# Patient Record
Sex: Male | Born: 1960 | Race: Black or African American | Hispanic: No | Marital: Single | Smoking: Current every day smoker
Health system: Southern US, Community
[De-identification: ages and names within clinical notes are randomized; demographics above are authoritative.]

## PROBLEM LIST (undated history)

## (undated) DIAGNOSIS — N289 Disorder of kidney and ureter, unspecified: Secondary | ICD-10-CM

---

## 1999-01-23 ENCOUNTER — Emergency Department (HOSPITAL_COMMUNITY): Admission: EM | Admit: 1999-01-23 | Discharge: 1999-01-23 | Payer: Self-pay | Admitting: Emergency Medicine

## 1999-01-23 ENCOUNTER — Encounter: Payer: Self-pay | Admitting: Emergency Medicine

## 1999-01-26 ENCOUNTER — Emergency Department (HOSPITAL_COMMUNITY): Admission: EM | Admit: 1999-01-26 | Discharge: 1999-01-26 | Payer: Self-pay | Admitting: Emergency Medicine

## 1999-02-02 ENCOUNTER — Emergency Department (HOSPITAL_COMMUNITY): Admission: EM | Admit: 1999-02-02 | Discharge: 1999-02-02 | Payer: Self-pay | Admitting: Emergency Medicine

## 1999-05-02 ENCOUNTER — Emergency Department (HOSPITAL_COMMUNITY): Admission: EM | Admit: 1999-05-02 | Discharge: 1999-05-02 | Payer: Self-pay | Admitting: Emergency Medicine

## 2004-01-04 ENCOUNTER — Emergency Department (HOSPITAL_COMMUNITY): Admission: EM | Admit: 2004-01-04 | Discharge: 2004-01-04 | Payer: Self-pay | Admitting: Emergency Medicine

## 2010-01-11 ENCOUNTER — Inpatient Hospital Stay (HOSPITAL_COMMUNITY)
Admission: EM | Admit: 2010-01-11 | Discharge: 2010-01-16 | Payer: Self-pay | Source: Home / Self Care | Admitting: Emergency Medicine

## 2010-01-12 ENCOUNTER — Encounter (INDEPENDENT_AMBULATORY_CARE_PROVIDER_SITE_OTHER): Payer: Self-pay | Admitting: Internal Medicine

## 2010-01-17 ENCOUNTER — Encounter (HOSPITAL_BASED_OUTPATIENT_CLINIC_OR_DEPARTMENT_OTHER)
Admission: RE | Admit: 2010-01-17 | Discharge: 2010-02-08 | Payer: Self-pay | Source: Home / Self Care | Admitting: General Surgery

## 2010-01-20 ENCOUNTER — Ambulatory Visit: Payer: Self-pay | Admitting: Internal Medicine

## 2010-01-20 DIAGNOSIS — F17200 Nicotine dependence, unspecified, uncomplicated: Secondary | ICD-10-CM

## 2010-01-20 DIAGNOSIS — L03119 Cellulitis of unspecified part of limb: Secondary | ICD-10-CM

## 2010-01-20 DIAGNOSIS — L02619 Cutaneous abscess of unspecified foot: Secondary | ICD-10-CM

## 2010-04-21 ENCOUNTER — Emergency Department (HOSPITAL_COMMUNITY)
Admission: EM | Admit: 2010-04-21 | Discharge: 2010-04-21 | Payer: Self-pay | Source: Home / Self Care | Admitting: Emergency Medicine

## 2010-05-12 ENCOUNTER — Encounter (INDEPENDENT_AMBULATORY_CARE_PROVIDER_SITE_OTHER): Payer: Self-pay | Admitting: Nurse Practitioner

## 2010-05-16 NOTE — Assessment & Plan Note (Signed)
Summary: XFU:  Right foot cellulitis   Vital Signs:  Patient profile:   50 year old male Height:      62.35 inches Weight:      143.7 pounds BMI:     26.08 Temp:     97.9 degrees F oral Pulse rate:   88 / minute Pulse rhythm:   regular Resp:     18 per minute BP sitting:   122 / 86  (left arm) Cuff size:   regular  Vitals Entered By: CMA Student Linzie Collin CC: f/u visit hospital patient Is Patient Diabetic? No Pain Assessment Patient in pain? no       Does patient need assistance? Functional Status Self care Ambulation Normal   CC:  f/u visit hospital patient.  History of Present Illness: New pt.  Admx to [facility] recently with right foot wound complicated by cellulitis and abscess.  He also had concomitant widespread tinea.  He required I&D.  He has had f/u with the wound clnic.  THey have kept him out of work until f/u next week.  He is supposed to see the surgeon back for f/u but cannot get an appt.  His foot feels better.  He wraps it daily.  No drainage or pain.  No fevers.    Habits & Providers  Alcohol-Tobacco-Diet     Tobacco Status: current  Exercise-Depression-Behavior     Drug Use: no  Current Medications (verified): 1)  Doxycycline Hyclate 100 Mg Caps (Doxycycline Hyclate) .... Take 1 Tablet By Mouth Twice Daily Morning and Night 2)  Hydrocodone-Acetaminophen 5-325 Mg Tabs (Hydrocodone-Acetaminophen) .... Take 1 Tablet By Mouth Every 4 Hours As Needed For Pain 3)  Cipro 500 Mg Tabs (Ciprofloxacin Hcl) .Marland Kitchen.. 1 Tablet By Mouth Twice Daily Morning and Night 4)  Diflucan 100 Mg Tabs (Fluconazole) .... Take 1 Tablet By Mouth Daily Morning  Allergies (verified): No Known Drug Allergies  Past History:  Past Medical History: infected right foot woud complicated by cellulitis/sq abscess - admx to [facility] 9/28 to 10/3 chronic dermatomycosis tobacco abuse  Past Surgical History: s/p ORIF 2/2 fracture of Left Leg 1990s s/p I&D of right foot wound 01/13/2010  a.  Dr. Rennis Chris (ortho) and Dr. 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 CV:  Denies chest pain or discomfort and shortness of breath with exertion. Resp:  Denies cough and wheezing. GI:  Denies change in bowel habits. GU:  Denies dysuria.  Physical Exam  General:  alert, well-developed, and well-nourished.   Head:  normocephalic and atraumatic.   Neck:  supple.   Lungs:  normal breath sounds.  + exp wheezes bilat bases  Heart:  normal rate and regular rhythm.   Abdomen:  soft, non-tender, and no hepatomegaly.   Extremities:  right foot with dressing in place; dry Neurologic:  alert & oriented X3 and cranial nerves II-XII intact.   Psych:  normally interactive.     Impression & Recommendations:  Problem # 1:  PREVENTIVE HEALTH CARE (ICD-V70.0) CMET, CBC, TSH ok in hosp except TP/Alb levels a little low A1C 5.6 HIV non reactive arrange CPE  Problem # 2:  CELLULITIS, FOOT, RIGHT (ICD-682.7) has f/u with wound clinic wants note to go back to  work but Dr. Jimmey Ralph gave him a note to stay out until he returns for f/u next week advised patient of this finish antibiotics as directed  His updated medication list for this problem includes:    Doxycycline Hyclate 100 Mg Caps (Doxycycline hyclate) .Marland Kitchen... Take 1 tablet by mouth twice daily morning and night    Cipro 500 Mg Tabs (Ciprofloxacin hcl) .Marland Kitchen... 1 tablet by mouth twice daily morning and night  Problem # 3:  SMOKER (ICD-305.1)  wheezing on exam prob has copd give proventil to use as needed   Complete Medication List: 1)  Doxycycline Hyclate 100 Mg Caps (Doxycycline hyclate) .... Take 1 tablet by mouth twice daily morning and night 2)   Hydrocodone-acetaminophen 5-325 Mg Tabs (Hydrocodone-acetaminophen) .... Take 1 tablet by mouth every 4 hours as needed for pain 3)  Cipro 500 Mg Tabs (Ciprofloxacin hcl) .Marland Kitchen.. 1 tablet by mouth twice daily morning and night 4)  Diflucan 100 Mg Tabs (Fluconazole) .... Take 1 tablet by mouth daily morning 5)  Proventil Hfa 108 (90 Base) Mcg/act Aers (Albuterol sulfate) .Marland Kitchen.. 1-2 puffs every 4-6 hours as needed for wheezing or cough or shortness of breath  Patient Instructions: 1)  Kenyatta:  Please schedule follow up at Dr. Caryn Bee Supple's office.  He was to follow up from [facility] by 01/30/2010. 2)  Schedule eligibility appt. 3)  Schedule CPE in 3 months. 4)  Keep follow up with Dr. Jimmey Ralph at the wound clinic next week. 5)  Return sooner if needed for any problems.  Prescriptions: PROVENTIL HFA 108 (90 BASE) MCG/ACT AERS (ALBUTEROL SULFATE) 1-2 puffs every 4-6 hours as needed for wheezing or cough or shortness of breath  #1 x 1   Entered and Authorized by:   Tereso Newcomer PA-C   Signed by:   Tereso Newcomer PA-C on 01/20/2010   Method used:   Print then Give to Patient   RxID:   [id number]

## 2010-05-18 NOTE — Letter (Signed)
Summary: mailed plan of care 01/17/10 to 03/17/10  mailed plan of care 01/17/10 to 03/17/10   Imported By: Silvio Pate Stanislawscyk 05/12/2010 09:54:57  _____________________________________________________________________  External Attachment:    Type:   Image     Comment:   External Document

## 2010-06-29 LAB — CBC
HCT: 34.9 % — ABNORMAL LOW (ref 39.0–52.0)
Hemoglobin: 12.1 g/dL — ABNORMAL LOW (ref 13.0–17.0)
Hemoglobin: 13.4 g/dL (ref 13.0–17.0)
Hemoglobin: 13.7 g/dL (ref 13.0–17.0)
MCH: 29.5 pg (ref 26.0–34.0)
MCH: 29.5 pg (ref 26.0–34.0)
MCH: 29.6 pg (ref 26.0–34.0)
MCHC: 34.7 g/dL (ref 30.0–36.0)
MCHC: 34.7 g/dL (ref 30.0–36.0)
MCHC: 36.2 g/dL — ABNORMAL HIGH (ref 30.0–36.0)
MCV: 83.5 fL (ref 78.0–100.0)
MCV: 85.1 fL (ref 78.0–100.0)
MCV: 85.2 fL (ref 78.0–100.0)
Platelets: 200 10*3/uL (ref 150–400)
Platelets: 206 10*3/uL (ref 150–400)
Platelets: 189 10*3/uL (ref 150–400)
RBC: 4.1 MIL/uL — ABNORMAL LOW (ref 4.22–5.81)
RBC: 4.02 MIL/uL — ABNORMAL LOW (ref 4.22–5.81)
RBC: 4.54 MIL/uL (ref 4.22–5.81)
RDW: 14.1 % (ref 11.5–15.5)
RDW: 14.1 % (ref 11.5–15.5)
RDW: 14.3 % (ref 11.5–15.5)
WBC: 5.6 10*3/uL (ref 4.0–10.5)
WBC: 6.1 10*3/uL (ref 4.0–10.5)
WBC: 7.9 10*3/uL (ref 4.0–10.5)

## 2010-06-29 LAB — DIFFERENTIAL
Basophils Absolute: 0 10*3/uL (ref 0.0–0.1)
Basophils Relative: 1 % (ref 0–1)
Monocytes Relative: 10 % (ref 3–12)
Neutro Abs: 4.6 10*3/uL (ref 1.7–7.7)
Neutrophils Relative %: 58 % (ref 43–77)

## 2010-06-29 LAB — BASIC METABOLIC PANEL
BUN: 9 mg/dL (ref 6–23)
BUN: 9 mg/dL (ref 6–23)
BUN: 8 mg/dL (ref 6–23)
CO2: 25 mEq/L (ref 19–32)
CO2: 25 mEq/L (ref 19–32)
Calcium: 8.3 mg/dL — ABNORMAL LOW (ref 8.4–10.5)
Calcium: 8.4 mg/dL (ref 8.4–10.5)
Calcium: 8.6 mg/dL (ref 8.4–10.5)
Calcium: 8.2 mg/dL — ABNORMAL LOW (ref 8.4–10.5)
Chloride: 107 mEq/L (ref 96–112)
Creatinine, Ser: 1.25 mg/dL (ref 0.4–1.5)
Creatinine, Ser: 1.42 mg/dL (ref 0.4–1.5)
Creatinine, Ser: 1.27 mg/dL (ref 0.4–1.5)
GFR calc Af Amer: >60 mL/min (ref >60)
GFR calc Af Amer: >60 mL/min (ref >60)
GFR calc Af Amer: >60 mL/min (ref >60)
GFR calc Af Amer: >60 mL/min (ref >60)
GFR calc non Af Amer: 52 mL/min — ABNORMAL LOW (ref >60)
GFR calc non Af Amer: 53 mL/min — ABNORMAL LOW (ref >60)
GFR calc non Af Amer: >60 mL/min (ref >60)
Glucose, Bld: 115 mg/dL — ABNORMAL HIGH (ref 70–99)
Glucose, Bld: 87 mg/dL (ref 70–99)
Potassium: 3.6 mEq/L (ref 3.5–5.1)
Sodium: 138 mEq/L (ref 135–145)
Sodium: 139 mEq/L (ref 135–145)

## 2010-06-29 LAB — COMPREHENSIVE METABOLIC PANEL
AST: 14 U/L (ref 0–37)
Alkaline Phosphatase: 57 U/L (ref 39–117)
BUN: 12 mg/dL (ref 6–23)
CO2: 22 mEq/L (ref 19–32)
Chloride: 115 mEq/L — ABNORMAL HIGH (ref 96–112)
Creatinine, Ser: 1.38 mg/dL (ref 0.4–1.5)
GFR calc Af Amer: >60 mL/min (ref >60)
GFR calc non Af Amer: 55 mL/min — ABNORMAL LOW (ref >60)
Total Bilirubin: 0.6 mg/dL (ref 0.3–1.2)

## 2010-06-29 LAB — VANCOMYCIN, TROUGH: Vancomycin Tr: 16.8 ug/mL (ref 10.0–20.0)

## 2010-06-29 LAB — RAPID URINE DRUG SCREEN, HOSP PERFORMED: Barbiturates: NOT DETECTED

## 2010-06-29 LAB — CULTURE, BLOOD (ROUTINE X 2): Culture  Setup Time: 201109290855

## 2010-06-29 LAB — POCT I-STAT, CHEM 8
Chloride: 111 mEq/L (ref 96–112)
Glucose, Bld: 102 mg/dL — ABNORMAL HIGH (ref 70–99)
HCT: 42 % (ref 39.0–52.0)
Potassium: 3.5 mEq/L (ref 3.5–5.1)

## 2010-06-29 LAB — HEMOGLOBIN A1C: Hgb A1c MFr Bld: 5.6 % (ref <5.7)

## 2010-06-29 LAB — TSH: TSH: 1.863 u[IU]/mL (ref 0.350–4.500)

## 2010-06-29 LAB — HIV ANTIBODY (ROUTINE TESTING W REFLEX): HIV: NONREACTIVE

## 2010-06-30 ENCOUNTER — Emergency Department (HOSPITAL_COMMUNITY)
Admission: EM | Admit: 2010-06-30 | Discharge: 2010-06-30 | Disposition: A | Discharge status: Home / Self Care | Payer: PRIVATE HEALTH INSURANCE | Attending: Emergency Medicine | Admitting: Emergency Medicine

## 2010-06-30 DIAGNOSIS — B353 Tinea pedis: Secondary | ICD-10-CM | POA: Insufficient documentation

## 2010-06-30 DIAGNOSIS — L299 Pruritus, unspecified: Secondary | ICD-10-CM | POA: Insufficient documentation

## 2011-03-08 ENCOUNTER — Emergency Department (HOSPITAL_COMMUNITY)
Admission: EM | Admit: 2011-03-08 | Discharge: 2011-03-08 | Disposition: A | Discharge status: Home / Self Care | Payer: PRIVATE HEALTH INSURANCE | Attending: Emergency Medicine | Admitting: Emergency Medicine

## 2011-03-08 DIAGNOSIS — F17200 Nicotine dependence, unspecified, uncomplicated: Secondary | ICD-10-CM | POA: Insufficient documentation

## 2011-03-08 DIAGNOSIS — L089 Local infection of the skin and subcutaneous tissue, unspecified: Secondary | ICD-10-CM | POA: Insufficient documentation

## 2011-03-08 LAB — GLUCOSE, CAPILLARY: Glucose-Capillary: 98 mg/dL (ref 70–99)

## 2011-03-08 MED ORDER — MOXIFLOXACIN HCL 400 MG PO TABS
400.0000 mg | ORAL_TABLET | Freq: Once | ORAL | Status: AC
  Administered 2011-03-08: 400 mg via ORAL
  Filled 2011-03-08: qty 1

## 2011-03-08 MED ORDER — CEPHALEXIN 500 MG PO CAPS: 500.0000 mg | ORAL_CAPSULE | Freq: Four times a day (QID) | ORAL | Status: AC

## 2011-03-08 NOTE — ED Notes (Signed)
Patient here with right foot wound that is draining x 3 weeks. Wound is posterior foot under base of toes, drainage noted

## 2011-03-08 NOTE — ED Provider Notes (Signed)
History     CSN: [id number] Arrival date & time: 03/08/2011 11:58 AM   First MD Initiated Contact with Patient 03/08/11 1228      No chief complaint on file.   (Consider location/radiation/quality/duration/timing/severity/associated sxs/prior treatment) The history is provided by the patient.  pt c/o right foot wound for 3 weeks. States area between right toes moist, draining small amt, sore. No injury. Stats hx same. Denies hx diabetes. No fever or chills. No leg or calf pain. No numbness/weakness.   History reviewed. No pertinent past medical history.  No past surgical history on file.  No family history on file.  History  Substance Use Topics  . Smoking status: Current Everyday Smoker -- 1.0 packs/day  . Smokeless tobacco: Not on file  . Alcohol Use: No      Review of Systems  Constitutional: Negative for fever and chills.  HENT: Negative for neck pain.   Eyes: Negative for redness.  Respiratory: Negative for shortness of breath.   Cardiovascular: Negative for chest pain and leg swelling.  Gastrointestinal: Negative for abdominal pain.  Musculoskeletal: Negative for back pain and joint swelling.  Skin: Negative for rash.  Neurological: Negative for weakness and numbness.  Hematological: Does not bruise/bleed easily.  Psychiatric/Behavioral: Negative for confusion.    Allergies  Review of patient's allergies indicates no known allergies.  Home Medications  No current outpatient prescriptions on file.  BP 105/83  Pulse 74  Temp 97.9 F (36.6 C)  Resp 18  SpO2 99%  Physical Exam  Nursing note and vitals reviewed. Constitutional: He is oriented to person, place, and time. He appears well-developed and well-nourished. No distress.  HENT:  Head: Atraumatic.  Eyes: Pupils are equal, round, and reactive to light.  Neck: Neck supple. No tracheal deviation present.  Cardiovascular: Normal rate.   Pulmonary/Chest: Effort normal. No accessory muscle usage. No  respiratory distress.  Abdominal: He exhibits no distension.  Musculoskeletal: Normal range of motion.       Pt with moist, macerating skin between toes right foot. Mild clear drainage. Minimal erythema. No necrotic tissue. Normal cap refill distally. Dp/pt 2+. No lymphangitis.   Neurological: He is alert and oriented to person, place, and time.  Skin: Skin is warm and dry.  Psychiatric: He has a normal mood and affect.    ED Course  Procedures (including critical care time)  Labs Reviewed - No data to display No results found. Results for orders placed during the hospital encounter of 03/08/11  GLUCOSE, CAPILLARY      Component Value Range   Glucose-Capillary 98  70 - 99 (mg/dL)   Comment 1 Notify RN     Comment 2 Documented in Chart     No results found.    No diagnosis found.    MDM  Wound cleaned. Dry sterile dressing. avelox po.         Suzi Roots, MD 03/08/11 1318

## 2011-10-01 ENCOUNTER — Emergency Department (HOSPITAL_COMMUNITY): Payer: No Typology Code available for payment source

## 2011-10-01 ENCOUNTER — Emergency Department (HOSPITAL_COMMUNITY)
Admission: EM | Admit: 2011-10-01 | Discharge: 2011-10-02 | Disposition: A | Discharge status: Home / Self Care | Payer: No Typology Code available for payment source | Attending: Emergency Medicine | Admitting: Emergency Medicine

## 2011-10-01 ENCOUNTER — Encounter (HOSPITAL_COMMUNITY): Payer: Self-pay | Admitting: *Deleted

## 2011-10-01 DIAGNOSIS — M79609 Pain in unspecified limb: Secondary | ICD-10-CM | POA: Insufficient documentation

## 2011-10-01 DIAGNOSIS — L089 Local infection of the skin and subcutaneous tissue, unspecified: Secondary | ICD-10-CM

## 2011-10-01 DIAGNOSIS — F17200 Nicotine dependence, unspecified, uncomplicated: Secondary | ICD-10-CM | POA: Insufficient documentation

## 2011-10-01 DIAGNOSIS — L989 Disorder of the skin and subcutaneous tissue, unspecified: Secondary | ICD-10-CM | POA: Insufficient documentation

## 2011-10-01 MED ORDER — MOXIFLOXACIN HCL 400 MG PO TABS: 400.0000 mg | ORAL_TABLET | Freq: Every day | ORAL | Status: DC

## 2011-10-01 MED ORDER — MOXIFLOXACIN HCL 400 MG PO TABS: 400.0000 mg | ORAL_TABLET | Freq: Every day | ORAL | Status: AC

## 2011-10-01 NOTE — ED Provider Notes (Signed)
History     CSN: [id number]  Arrival date & time 10/01/11  7829   First MD Initiated Contact with Patient 10/01/11 2111      9:36 PM HPI Patient reports about 2-3 weeks ago was walking in the rain. States since then has had worsening wounds between his toes. Reports his attempts to keep area is clean and dry but drainage and wound appeared to be increasing. Reports similar symptoms last November. Denies fever or severe pain. Denies history of diabetes. Denies numbness, tingling or weakness.  Patient is a 51 y.o. male presenting with lower extremity pain. The history is provided by the patient.  Foot Pain This is a new problem. The current episode started 1 to 4 weeks ago. The problem occurs constantly. The problem has been gradually worsening. Pertinent negatives include no fever, joint swelling, numbness or weakness. The symptoms are aggravated by walking and standing.    History reviewed. No pertinent past medical history.  No past surgical history on file.  No family history on file.  History  Substance Use Topics  . Smoking status: Current Everyday Smoker -- 1.0 packs/day  . Smokeless tobacco: Not on file  . Alcohol Use: No      Review of Systems  Constitutional: Negative for fever.  Musculoskeletal: Negative for joint swelling.  Skin: Positive for color change and wound (foot pain).  Neurological: Negative for weakness and numbness.  All other systems reviewed and are negative.    Allergies  Review of patient's allergies indicates no known allergies.  Home Medications   Current Outpatient Rx  Name Route Sig Dispense Refill  . IBUPROFEN 200 MG PO TABS Oral Take 600 mg by mouth every 6 (six) hours as needed. Pain.      BP 111/74  Pulse 78  Temp 97.8 F (36.6 C) (Oral)  Resp 16  Ht 5\' 5"  (1.651 m)  Wt 145 lb (65.772 kg)  BMI 24.13 kg/m2  SpO2 98%  Physical Exam  Constitutional: He is oriented to person, place, and time. He appears well-developed and  well-nourished.  HENT:  Head: Normocephalic and atraumatic.  Eyes: Pupils are equal, round, and reactive to light.  Musculoskeletal:       Right foot: He exhibits tenderness. He exhibits normal range of motion, no swelling, normal capillary refill and no crepitus.       Right foot: Patient is severe maceration between third fourth and fifth toes. Draining a clear to yellowish colored liquid. No abscess palpated  Neurological: He is alert and oriented to person, place, and time.  Skin: Skin is warm and dry. No rash noted. No erythema. No pallor.  Psychiatric: He has a normal mood and affect. His behavior is normal.    ED Course  Procedures  Dg Foot Complete Right  10/01/2011  *RADIOLOGY REPORT*  Clinical Data: Right infection and pain.  RIGHT FOOT COMPLETE - 3+ VIEW  Comparison:  01/11/2010  Findings:  There is no evidence of fracture or dislocation.  There is no evidence of arthropathy or other focal bone abnormality. No evidence of osteolysis or periostitis.  Soft tissues are unremarkable.  IMPRESSION: Negative.  Original Report Authenticated By: Danae Orleans, M.D.     MDM     Xray was negative for osteo or necrosis. Advised close f/u with wound care. Will d/c with Avelox and referral for wound care. Pt voices understanding and is ready for d/c. Wound cleaned and redressed.      Thomasene Lot, PA-C 10/01/11 2238

## 2011-10-01 NOTE — Discharge Instructions (Signed)
Skin Infections A skin infection usually develops as a result of disruption of the skin barrier.  CAUSES  A skin infection might occur following:  Trauma or an injury to the skin such as a cut or insect sting.   Inflammation (as in eczema).   Breaks in the skin between the toes (as in athlete's foot).   Swelling (edema).  SYMPTOMS  The legs are the most common site affected. Usually there is:  Redness.   Swelling.   Pain.   There may be red streaks in the area of the infection.  TREATMENT   Minor skin infections may be treated with topical antibiotics, but if the skin infection is severe, hospital care and intravenous (IV) antibiotic treatment may be needed.   Most often skin infections can be treated with oral antibiotic medicine as well as proper rest and elevation of the affected area until the infection improves.   If you are prescribed oral antibiotics, it is important to take them as directed and to take all the pills even if you feel better before you have finished all of the medicine.   You may apply warm compresses to the area for 20-30 minutes 4 times daily.  You might need a tetanus shot now if:  You have no idea when you had the last one.   You have never had a tetanus shot before.   Your wound had dirt in it.  If you need a tetanus shot and you decide not to get one, there is a rare chance of getting tetanus. Sickness from tetanus can be serious. If you get a tetanus shot, your arm may swell and become red and warm at the shot site. This is common and not a problem. SEEK MEDICAL CARE IF:  The pain and swelling from your infection do not improve within 2 days.  SEEK IMMEDIATE MEDICAL CARE IF:  You develop a fever, chills, or other serious problems.  Document Released: 05/10/2004 Document Revised: 03/22/2011 Document Reviewed: 03/22/2008 Exit[employer] Patient Information 2012 [employer]. 

## 2011-10-01 NOTE — ED Notes (Signed)
Pt reports rt foot pain/wound x2-3 weeks, pt w/ excoriation noted to bottom of foot and in between toes. Pt states he has been attempting to keep it wrapped in guaze and putting cream on it w/o relief.

## 2011-10-02 NOTE — ED Provider Notes (Signed)
Medical screening examination/treatment/procedure(s) were performed by non-physician practitioner and as supervising physician I was immediately available for consultation/collaboration.   Hurman Horn, MD 10/02/11 [phone]

## 2011-12-13 ENCOUNTER — Inpatient Hospital Stay (HOSPITAL_COMMUNITY)
Admission: EM | Admit: 2011-12-13 | Discharge: 2011-12-15 | DRG: 683 | Disposition: A | Discharge status: Home / Self Care | Payer: No Typology Code available for payment source | Attending: Internal Medicine | Admitting: Internal Medicine

## 2011-12-13 ENCOUNTER — Encounter (HOSPITAL_COMMUNITY): Payer: Self-pay | Admitting: *Deleted

## 2011-12-13 ENCOUNTER — Emergency Department (HOSPITAL_COMMUNITY): Payer: No Typology Code available for payment source

## 2011-12-13 DIAGNOSIS — R252 Cramp and spasm: Secondary | ICD-10-CM | POA: Diagnosis present

## 2011-12-13 DIAGNOSIS — N289 Disorder of kidney and ureter, unspecified: Secondary | ICD-10-CM

## 2011-12-13 DIAGNOSIS — N179 Acute kidney failure, unspecified: Principal | ICD-10-CM | POA: Diagnosis present

## 2011-12-13 DIAGNOSIS — F17200 Nicotine dependence, unspecified, uncomplicated: Secondary | ICD-10-CM | POA: Diagnosis present

## 2011-12-13 DIAGNOSIS — R911 Solitary pulmonary nodule: Secondary | ICD-10-CM | POA: Diagnosis present

## 2011-12-13 DIAGNOSIS — E871 Hypo-osmolality and hyponatremia: Secondary | ICD-10-CM | POA: Diagnosis present

## 2011-12-13 DIAGNOSIS — E860 Dehydration: Secondary | ICD-10-CM | POA: Diagnosis present

## 2011-12-13 DIAGNOSIS — E876 Hypokalemia: Secondary | ICD-10-CM | POA: Diagnosis not present

## 2011-12-13 LAB — CBC WITH DIFFERENTIAL/PLATELET
Basophils Absolute: 0 10*3/uL (ref 0.0–0.1)
Basophils Relative: 0 % (ref 0–1)
Eosinophils Absolute: 0.1 10*3/uL (ref 0.0–0.7)
Eosinophils Relative: 1 % (ref 0–5)
HCT: 45.9 % (ref 39.0–52.0)
MCH: 30.4 pg (ref 26.0–34.0)
MCHC: 37 g/dL — ABNORMAL HIGH (ref 30.0–36.0)
MCV: 82 fL (ref 78.0–100.0)
Monocytes Absolute: 1.3 10*3/uL — ABNORMAL HIGH (ref 0.1–1.0)
Neutro Abs: 8 10*3/uL — ABNORMAL HIGH (ref 1.7–7.7)
RDW: 13.7 % (ref 11.5–15.5)

## 2011-12-13 LAB — COMPREHENSIVE METABOLIC PANEL
AST: 15 U/L (ref 0–37)
Albumin: 5 g/dL (ref 3.5–5.2)
BUN: 24 mg/dL — ABNORMAL HIGH (ref 6–23)
Calcium: 10.2 mg/dL (ref 8.4–10.5)
Creatinine, Ser: 2.58 mg/dL — ABNORMAL HIGH (ref 0.50–1.35)
Total Protein: 8.6 g/dL — ABNORMAL HIGH (ref 6.0–8.3)

## 2011-12-13 LAB — URINALYSIS, ROUTINE W REFLEX MICROSCOPIC
Bilirubin Urine: NEGATIVE
Ketones, ur: NEGATIVE mg/dL
Protein, ur: NEGATIVE mg/dL
Specific Gravity, Urine: 1.021 (ref 1.005–1.030)
Urobilinogen, UA: 0.2 mg/dL (ref 0.0–1.0)

## 2011-12-13 LAB — LIPASE, BLOOD: Lipase: 24 U/L (ref 11–59)

## 2011-12-13 LAB — URINE MICROSCOPIC-ADD ON

## 2011-12-13 MED ORDER — SODIUM CHLORIDE 0.9 % IV BOLUS (SEPSIS)
1000.0000 mL | Freq: Once | INTRAVENOUS | Status: AC
  Administered 2011-12-13: 1000 mL via INTRAVENOUS

## 2011-12-13 NOTE — ED Notes (Signed)
Pt states "I'm cramped up in my stomach side." denies n/v. C/o abd pain. Also c/o pain to left leg. "charlie horse."

## 2011-12-13 NOTE — H&P (Addendum)
PCP:   None   Chief Complaint:  Cramps  HPI: This is a 51 year old gentleman who states he has no past medical history. He states he was at work when he developed full body cramps. Patient states his cramps were severe and generalized. He states he had cramps in his neck, in his back, in his feet, in his legs, all over. He came to the ER. In the ER the patient's creatinine was found to be elevated. The patient denies any nausea, vomiting, diarrhea, fevers, chills,, burning urination. He reports no change in his urinary pattern. He does work in a [employer] and states he worked hard today in a hot environments. The patient does not frequently see a doctor, he has no knowledge of prior kidney issues. However, on reviewing labs from previous ER visits, there was evidence of mild chronic kidney disease in 2011.  Review of Systems:  The patient denies anorexia, fever, weight loss,, vision loss, decreased hearing, hoarseness, chest pain, syncope, dyspnea on exertion, peripheral edema, balance deficits, hemoptysis, abdominal pain, melena, hematochezia, severe indigestion/heartburn, hematuria, incontinence, genital sores, muscle weakness, suspicious skin lesions, transient blindness, difficulty walking, depression, unusual weight change, abnormal bleeding, enlarged lymph nodes, angioedema, and breast masses.  Past Medical History: History reviewed. No pertinent past medical history. History reviewed. No pertinent past surgical history.  Medications: Prior to Admission medications   Not on File  No medication  Allergies:  No Known Allergies  Social History:  reports that he has been smoking Cigarettes.  He has been smoking about 1 pack per day. He does not have any smokeless tobacco history on file. He reports that he does not drink alcohol. His drug history not on file.  Family History: History reviewed. No pertinent family history.  Physical Exam: Filed Vitals:   12/13/11 1755 12/13/11 2005    BP: 133/91 130/87  Pulse: 83 94  Temp: 98.7 F (37.1 C) 97.3 F (36.3 C)  TempSrc: Oral Oral  Resp: 17 18  SpO2: 96% 100%    General:  Alert and oriented times three, well developed and nourished, no acute distress Eyes: PERRLA, pink conjunctiva, no scleral icterus ENT: Moist oral mucosa, neck supple, no thyromegaly Lungs: clear to ascultation, no wheeze, no crackles, no use of accessory muscles Cardiovascular: regular rate and rhythm, no regurgitation, no gallops, no murmurs. No carotid bruits, no JVD Abdomen: soft, positive BS, non-tender, non-distended, no organomegaly, not an acute abdomen GU: not examined Neuro: CN II - XII grossly intact, sensation intact Musculoskeletal: strength 5/5 all extremities, no clubbing, cyanosis or edema Skin: no rash, no subcutaneous crepitation, no decubitus Psych: appropriate patient   Labs on Admission:   [facility] 12/13/11 1837  NA 136  K 3.6  CL 95*  CO2 22  GLUCOSE 179*  BUN 24*  CREATININE 2.58*  CALCIUM 10.2  MG --  PHOS --    Basename 12/13/11 1837  AST 15  ALT 10  ALKPHOS 88  BILITOT 0.4  PROT 8.6*  ALBUMIN 5.0    Basename 12/13/11 1837  LIPASE 24  AMYLASE --    Basename 12/13/11 1837  WBC 12.1*  NEUTROABS 8.0*  HGB 17.0  HCT 45.9  MCV 82.0  PLT 259   No results found for this basename: CKTOTAL:3,CKMB:3,CKMBINDEX:3,TROPONINI:3 in the last 72 hours No components found with this basename: POCBNP:3 No results found for this basename: DDIMER:2 in the last 72 hours No results found for this basename: HGBA1C:2 in the last 72 hours No results found for this  basename: CHOL:2,HDL:2,LDLCALC:2,TRIG:2,CHOLHDL:2,LDLDIRECT:2 in the last 72 hours No results found for this basename: TSH,T4TOTAL,FREET3,T3FREE,THYROIDAB in the last 72 hours No results found for this basename: VITAMINB12:2,FOLATE:2,FERRITIN:2,TIBC:2,IRON:2,RETICCTPCT:2 in the last 72 hours  Micro Results: No results found for this or any previous visit  (from the past 240 hour(s)).  Results for DELMA, VILLALVA (MRN [id number]) as of 12/13/2011 23:51  Ref. Range 12/13/2011 19:40  Color, Urine Latest Range: YELLOW  YELLOW  APPearance Latest Range: CLEAR  CLOUDY (A)  Specific Gravity, Urine Latest Range: 1.005-1.030  1.021  pH Latest Range: 5.0-8.0  5.0  Glucose Latest Range: NEGATIVE mg/dL NEGATIVE  Bilirubin Urine Latest Range: NEGATIVE  NEGATIVE  Ketones, ur Latest Range: NEGATIVE mg/dL NEGATIVE  Protein Latest Range: NEGATIVE mg/dL NEGATIVE  Urobilinogen, UA Latest Range: 0.0-1.0 mg/dL 0.2  Nitrite Latest Range: NEGATIVE  NEGATIVE  Leukocytes, UA Latest Range: NEGATIVE  NEGATIVE  Hgb urine dipstick Latest Range: NEGATIVE  MODERATE (A)  RBC / HPF Latest Range: <3 RBC/hpf 3-6  Squamous Epithelial / LPF Latest Range: RARE  MANY (A)  Bacteria, UA Latest Range: RARE  RARE  Casts Latest Range: NEGATIVE  HYALINE CASTS (A)    Radiological Exams on Admission: Ct Abdomen Pelvis Wo Contrast  12/13/2011  *RADIOLOGY REPORT*  Clinical Data: Right-sided abdominal pain  CT ABDOMEN AND PELVIS WITHOUT CONTRAST  Technique:  Multidetector CT imaging of the abdomen and pelvis was performed following the standard protocol without intravenous contrast.  Comparison: None.  Findings: Clustered 304 mm nodules within the right middle lobe heart size within normal limits.  Organ abnormality/lesion detection is limited in the absence of intravenous contrast. Within this limitation, unremarkable liver, biliary system, spleen, pancreas, adrenal glands, kidneys.  No hydronephrosis or hydroureter.  No urinary tract calculi.  No bowel obstruction.  No CT evidence for colitis.  Normal appendix.  No free intraperitoneal air or fluid.  No lymphadenopathy.  Minimal scattered atherosclerotic disease of the aorta and branch vessels.  Normal caliber.  Circumferential bladder wall thickening nonspecific given incomplete distension.  No acute osseous finding.  IMPRESSION: Clustered right  middle lobe nodules is a nonspecific finding often infectious or inflammatory.  Recommend 62-month chest CT follow-up.  Circumferential bladder wall thickening is nonspecific given incomplete distension.  Correlate with urinalysis and cystoscopy if warranted.  Otherwise, within limitations of a unenhanced examination, no acute abnormality identified within the abdomen pelvis.   Original Report Authenticated By: Waneta Martins, M.D.     Assessment/Plan Present on Admission:  .Acute kidney injury Admit to MedSurg  IV fluid hydration I remain unclear if this acute or chronic renal failure. On review of patient's labs on 2011 he does have some chronic kidney issues and but mild. No recent labs for comparison Well check a phosphorus level. May have an element of dehydration/heat exposure Repeat BMP  Circumferential bladder wall thickening on CT, defer to a.m. team for urology consult Cramps Will add a magnesium level and CPK  .SMOKER Nicotine patch Clustered right middle lobe nodule Followup CT in 3 months  Full code DVT prophylaxis  Syvanna Ciolino 12/13/2011, 11:51 PM

## 2011-12-13 NOTE — ED Notes (Signed)
Pt in from work by [employer]. Pt c/o R sided abd pain. 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

## 2011-12-13 NOTE — ED Notes (Signed)
Returned from CT.

## 2011-12-13 NOTE — ED Provider Notes (Signed)
History     CSN: [id number]  Arrival date & time 12/13/11  1744   First MD Initiated Contact with Patient 12/13/11 1837      Chief Complaint  Patient presents with  . Abdominal Pain    (Consider location/radiation/quality/duration/timing/severity/associated sxs/prior treatment) HPI A LEVEL 5 CAVEAT PERTAINS DUE TO POOR HISTORIAN Pt presents with c/o right lower abdominal pain.  He states that his pain began this afternoon and started in the right lower abdomen, then spread to the left lower abdomen.  He also describes pain in he arm, neck and legs.  Pt is a poor historian and is unable to characterize his symptoms further.  He does state he was lifting heavy objects at his job when pain began.  He denies fever, vomiting, changes in stools.  He denies dysuria or back pain.    History reviewed. No pertinent past medical history.  History reviewed. No pertinent past surgical history.  History reviewed. No pertinent family history.  History  Substance Use Topics  . Smoking status: Current Everyday Smoker -- 1.0 packs/day    Types: Cigarettes  . Smokeless tobacco: Not on file  . Alcohol Use: No      Review of Systems UNABLE TO OBTAIN ROS DUE TO LEVEL 5 CAVEAT  Allergies  Review of patient's allergies indicates no known allergies.  Home Medications  No current outpatient prescriptions on file.  BP 130/87  Pulse 94  Temp 97.3 F (36.3 C) (Oral)  Resp 18  SpO2 100% Vitals reviewed Physical Exam Physical Examination: General appearance - alert, well appearing, and in no distress Mental status - alert, oriented to person, place, and time Eyes - pupils equal and reactive, extraocular eye movements intact, no conjunctival injection, no scleral icterus Mouth - mucous membranes moist, pharynx normal without lesions Neck - supple, no significant adenopathy Chest - clear to auscultation, no wheezes, rales or rhonchi, symmetric air entry Heart - normal rate, regular rhythm,  normal S1, S2, no murmurs, rubs, clicks or gallops Abdomen - soft, ttp in right lower abdomen, no gaurding or rebound, nondistended, no masses or organomegaly, nabs Neurological - alert, oriented, normal speech, strength 5/5 in extremities x 4, sensation intact Musculoskeletal - no joint tenderness, deformity or swelling Extremities - peripheral pulses normal, no pedal edema, no clubbing or cyanosis Skin - normal coloration and turgor, no rashes  ED Course  Procedures (including critical care time)  10:59 PM  D/w Si Raider, [facility]- she will evaluate patient in the ED.  Unclear whether he has acute renal insufficiency or acute on chronic.  Has received saline bolus- will recheck BMP now as well.    Labs Reviewed  CBC WITH DIFFERENTIAL - Abnormal; Notable for the following:    WBC 12.1 (*)     MCHC 37.0 (*)     Neutro Abs 8.0 (*)     Monocytes Absolute 1.3 (*)     All other components within normal limits  COMPREHENSIVE METABOLIC PANEL - Abnormal; Notable for the following:    Chloride 95 (*)     Glucose, Bld 179 (*)     BUN 24 (*)     Creatinine, Ser 2.58 (*)     Total Protein 8.6 (*)     GFR calc non Af Amer 27 (*)     GFR calc Af Amer 32 (*)     All other components within normal limits  URINALYSIS, ROUTINE W REFLEX MICROSCOPIC - Abnormal; Notable for the following:    APPearance  CLOUDY (*)     Hgb urine dipstick MODERATE (*)     All other components within normal limits  URINE MICROSCOPIC-ADD ON - Abnormal; Notable for the following:    Squamous Epithelial / LPF MANY (*)     Casts HYALINE CASTS (*)     All other components within normal limits  LIPASE, BLOOD  CK  BASIC METABOLIC PANEL  MAGNESIUM   Ct Abdomen Pelvis Wo Contrast  12/13/2011  *RADIOLOGY REPORT*  Clinical Data: Right-sided abdominal pain  CT ABDOMEN AND PELVIS WITHOUT CONTRAST  Technique:  Multidetector CT imaging of the abdomen and pelvis was performed following the standard protocol without  intravenous contrast.  Comparison: None.  Findings: Clustered 304 mm nodules within the right middle lobe heart size within normal limits.  Organ abnormality/lesion detection is limited in the absence of intravenous contrast. Within this limitation, unremarkable liver, biliary system, spleen, pancreas, adrenal glands, kidneys.  No hydronephrosis or hydroureter.  No urinary tract calculi.  No bowel obstruction.  No CT evidence for colitis.  Normal appendix.  No free intraperitoneal air or fluid.  No lymphadenopathy.  Minimal scattered atherosclerotic disease of the aorta and branch vessels.  Normal caliber.  Circumferential bladder wall thickening nonspecific given incomplete distension.  No acute osseous finding.  IMPRESSION: Clustered right middle lobe nodules is a nonspecific finding often infectious or inflammatory.  Recommend 49-month chest CT follow-up.  Circumferential bladder wall thickening is nonspecific given incomplete distension.  Correlate with urinalysis and cystoscopy if warranted.  Otherwise, within limitations of a unenhanced examination, no acute abnormality identified within the abdomen pelvis.   Original Report Authenticated By: Waneta Martins, M.D.      1. Renal insufficiency       MDM  Pt presenting with c/o abdominal pain.  He also describes pain in arm/neck and legs.  CT scan did not show any acute abnormalities, labs reveal renal insufficiency, potassium normal.  Pt states he cannot remember when he last saw a doctor- unclear whether this is new or chronic.  D/w [facility] for admission.  Pt was hydrated with NS in the ED.          Ethelda Chick, MD 12/14/11 Marlyne Beards

## 2011-12-14 DIAGNOSIS — E876 Hypokalemia: Secondary | ICD-10-CM

## 2011-12-14 DIAGNOSIS — E860 Dehydration: Secondary | ICD-10-CM | POA: Diagnosis present

## 2011-12-14 DIAGNOSIS — N179 Acute kidney failure, unspecified: Principal | ICD-10-CM

## 2011-12-14 DIAGNOSIS — R252 Cramp and spasm: Secondary | ICD-10-CM | POA: Diagnosis present

## 2011-12-14 LAB — COMPREHENSIVE METABOLIC PANEL
AST: 27 U/L (ref 0–37)
Albumin: 3.5 g/dL (ref 3.5–5.2)
Chloride: 104 mEq/L (ref 96–112)
Creatinine, Ser: 1.14 mg/dL (ref 0.50–1.35)
Total Bilirubin: 0.4 mg/dL (ref 0.3–1.2)

## 2011-12-14 LAB — BASIC METABOLIC PANEL
CO2: 17 mEq/L — ABNORMAL LOW (ref 19–32)
Calcium: 8.7 mg/dL (ref 8.4–10.5)
GFR calc non Af Amer: 52 mL/min — ABNORMAL LOW (ref >90)
Sodium: 135 mEq/L (ref 135–145)

## 2011-12-14 LAB — PHOSPHORUS: Phosphorus: 3.2 mg/dL (ref 2.3–4.6)

## 2011-12-14 LAB — CBC
MCV: 81.6 fL (ref 78.0–100.0)
Platelets: 223 10*3/uL (ref 150–400)
RDW: 13.8 % (ref 11.5–15.5)
WBC: 9.5 10*3/uL (ref 4.0–10.5)

## 2011-12-14 LAB — CK: Total CK: 1141 U/L — ABNORMAL HIGH (ref 7–232)

## 2011-12-14 LAB — MAGNESIUM: Magnesium: 1.7 mg/dL (ref 1.5–2.5)

## 2011-12-14 MED ORDER — POTASSIUM CHLORIDE CRYS ER 20 MEQ PO TBCR
40.0000 meq | EXTENDED_RELEASE_TABLET | ORAL | Status: AC
  Administered 2011-12-14 (×2): 40 meq via ORAL
  Filled 2011-12-14 (×2): qty 2

## 2011-12-14 MED ORDER — ENOXAPARIN SODIUM 40 MG/0.4ML ~~LOC~~ SOLN
40.0000 mg | SUBCUTANEOUS | Status: DC
  Administered 2011-12-14 – 2011-12-15 (×2): 40 mg via SUBCUTANEOUS
  Filled 2011-12-14 (×2): qty 0.4

## 2011-12-14 MED ORDER — ACETAMINOPHEN 325 MG PO TABS: 650.0000 mg | ORAL_TABLET | Freq: Four times a day (QID) | ORAL | Status: DC | PRN

## 2011-12-14 MED ORDER — HYDROCODONE-ACETAMINOPHEN 5-325 MG PO TABS: 1.0000 | ORAL_TABLET | ORAL | Status: DC | PRN

## 2011-12-14 MED ORDER — SODIUM CHLORIDE 0.9 % IV SOLN
INTRAVENOUS | Status: DC
  Administered 2011-12-14 – 2011-12-15 (×3): via INTRAVENOUS

## 2011-12-14 MED ORDER — MAGNESIUM SULFATE 40 MG/ML IJ SOLN
2.0000 g | Freq: Once | INTRAMUSCULAR | Status: AC
  Administered 2011-12-14: 2 g via INTRAVENOUS
  Filled 2011-12-14: qty 50

## 2011-12-14 MED ORDER — PNEUMOCOCCAL VAC POLYVALENT 25 MCG/0.5ML IJ INJ
0.5000 mL | INJECTION | INTRAMUSCULAR | Status: AC
  Administered 2011-12-15: 0.5 mL via INTRAMUSCULAR
  Filled 2011-12-14: qty 0.5

## 2011-12-14 MED ORDER — ENOXAPARIN SODIUM 30 MG/0.3ML ~~LOC~~ SOLN: 30.0000 mg | SUBCUTANEOUS | Status: DC

## 2011-12-14 MED ORDER — ACETAMINOPHEN 650 MG RE SUPP: 650.0000 mg | Freq: Four times a day (QID) | RECTAL | Status: DC | PRN

## 2011-12-14 NOTE — Progress Notes (Signed)
UR done. 

## 2011-12-14 NOTE — Care Management Note (Signed)
    Page 1 of 1   12/14/2011     4:25:07 PM   CARE MANAGEMENT NOTE 12/14/2011  Patient:  Tony Keith, Tony Keith   Account Number:  [id number]  Date Initiated:  12/14/2011  Documentation initiated by:  CRAFT,TERRI  Subjective/Objective Assessment:   51 yo male admitted 12/13/11 with abdominal pain.     Action/Plan:   D/C when medically stable   Anticipated DC Date:  12/17/2011   Anticipated DC Plan:  HOME/SELF CARE      DC Planning Services  CM consult              Status of service:  Completed, signed off  Discharge Disposition:  HOME/SELF CARE  Per UR Regulation:  Reviewed for med. necessity/level of care/duration of stay   Comments:  12/14/11, Kathi Der RNC-MNN, BSN, [phone], CM received referral from MD.  Pt given list of  PCP's  for follow up as well as community resources and list of pharmacies with $4 medications.  CM will continue to follow.

## 2011-12-14 NOTE — Progress Notes (Signed)
[facility] PROGRESS NOTE  Tony Keith ZOX:[id number] DOB: January 31, 1961 DOA: 12/13/2011 PCP: No primary provider on file.  Assessment/Plan: Principal Problem:  *ARF (acute renal failure) Active Problems:  SMOKER  Cramps, muscle, general  Hypokalemia  Dehydration  #1 acute renal failure Likely secondary to prerenal azotemia in the setting of volume depletion, dehydration and probable muscle breakdown. Renal function has improved with hydration. Continue IV fluids. Follow.  #2 generalized muscle cramps Likely secondary to heat exhaustion and probable muscle breakdown. Patient with slightly elevated total creatinine kinase which seems to be increasing. Patient denies any chest pain or shortness of breath. EKG with no ST-T wave abnormalities. Will increase IV fluids 250 cc per hour. We'll repeat total creatinine kinase in the morning. Follow. Supportive care.  #3 hypokalemia Replete  #4 dehydration IV fluids.  #5 right middle lobe nodule per CT scan Will need a followup CT scan in approximately 3 months.  #6 circumferential bladder wall thickening Will need outpatient followup.  #7 prophylaxis PPI for GI prophylaxis. Lovenox for DVT prophylaxis.   Code Status: full Family Communication: Discussed with patient at bedside. Disposition Plan: Home when medically stable   Brief narrative: This is a 51 year old gentleman who states he has no past medical history. He states he was at work when he developed full body cramps. Patient states his cramps were severe and generalized. He states he had cramps in his neck, in his back, in his feet, in his legs, all over. He came to the ER. In the ER the patient's creatinine was found to be elevated. The patient denies any nausea, vomiting, diarrhea, fevers, chills,, burning urination. He reports no change in his urinary pattern. He does work in a [employer] and states he worked hard today in a hot environments. The patient does not frequently  see a doctor, he has no knowledge of prior kidney issues. However, on reviewing labs from previous ER visits, there was evidence of mild chronic kidney disease in 2011.   Consultants:  None  Procedures:  CT abdomen and pelvis 12/13/2011  Antibiotics:  None  HPI/Subjective: Patient states he feels much better. Patient states that he's cramps has improved. Patient denies any chest pain, no shortness of breath.  Objective: Filed Vitals:   12/13/11 1755 12/13/11 2005 12/14/11 0430 12/14/11 1405  BP: 133/91 130/87 122/75 121/73  Pulse: 83 94 82 79  Temp: 98.7 F (37.1 C) 97.3 F (36.3 C) 97.4 F (36.3 C) 97.9 F (36.6 C)  TempSrc: Oral Oral Oral Oral  Resp: 17 18 19 16   Height:   5\' 5"  (1.651 m)   Weight:   64.5 kg (142 lb 3.2 oz)   SpO2: 96% 100% 99% 98%    Intake/Output Summary (Last 24 hours) at 12/14/11 1621 Last data filed at 12/14/11 1408  Gross per 24 hour  Intake    240 ml  Output      2 ml  Net    238 ml   Filed Weights   12/14/11 0430  Weight: 64.5 kg (142 lb 3.2 oz)    Exam:   General:  NAD.  Cardiovascular: RRR, no m/r/g. no LE edema  Respiratory: CTAB, no wheezes, no rhonchi  Abdomen: Soft, NT, ND, + BS  Data Reviewed: Basic Metabolic Panel:  Lab 12/14/11 8119 12/13/11 2350 12/13/11 2340 12/13/11 1837  NA 137 -- 135 136  K 3.3* -- 4.0 3.6  CL 104 -- 103 95*  CO2 23 -- 17* 22  GLUCOSE 130* -- 102* 179*  BUN 17 -- 20 24*  CREATININE 1.14 -- 1.51* 2.58*  CALCIUM 8.9 -- 8.7 10.2  MG -- 1.7 -- --  PHOS -- 3.2 -- --   Liver Function Tests:  Lab 12/14/11 0650 12/13/11 1837  AST 27 15  ALT 11 10  ALKPHOS 65 88  BILITOT 0.4 0.4  PROT 6.3 8.6*  ALBUMIN 3.5 5.0    Lab 12/13/11 1837  LIPASE 24  AMYLASE --   No results found for this basename: AMMONIA:5 in the last 168 hours CBC:  Lab 12/14/11 0650 12/13/11 1837  WBC 9.5 12.1*  NEUTROABS -- 8.0*  HGB 13.5 17.0  HCT 37.7* 45.9  MCV 81.6 82.0  PLT 223 259   Cardiac  Enzymes:  Lab 12/14/11 0650 12/13/11 2340  CKTOTAL 2863* 1141*  CKMB -- --  CKMBINDEX -- --  TROPONINI -- --   BNP (last 3 results) No results found for this basename: PROBNP:3 in the last 8760 hours CBG: No results found for this basename: GLUCAP:5 in the last 168 hours  No results found for this or any previous visit (from the past 240 hour(s)).   Studies: Ct Abdomen Pelvis Wo Contrast  12/13/2011  *RADIOLOGY REPORT*  Clinical Data: Right-sided abdominal pain  CT ABDOMEN AND PELVIS WITHOUT CONTRAST  Technique:  Multidetector CT imaging of the abdomen and pelvis was performed following the standard protocol without intravenous contrast.  Comparison: None.  Findings: Clustered 304 mm nodules within the right middle lobe heart size within normal limits.  Organ abnormality/lesion detection is limited in the absence of intravenous contrast. Within this limitation, unremarkable liver, biliary system, spleen, pancreas, adrenal glands, kidneys.  No hydronephrosis or hydroureter.  No urinary tract calculi.  No bowel obstruction.  No CT evidence for colitis.  Normal appendix.  No free intraperitoneal air or fluid.  No lymphadenopathy.  Minimal scattered atherosclerotic disease of the aorta and branch vessels.  Normal caliber.  Circumferential bladder wall thickening nonspecific given incomplete distension.  No acute osseous finding.  IMPRESSION: Clustered right middle lobe nodules is a nonspecific finding often infectious or inflammatory.  Recommend 38-month chest CT follow-up.  Circumferential bladder wall thickening is nonspecific given incomplete distension.  Correlate with urinalysis and cystoscopy if warranted.  Otherwise, within limitations of a unenhanced examination, no acute abnormality identified within the abdomen pelvis.   Original Report Authenticated By: Waneta Martins, M.D.     Scheduled Meds:   . enoxaparin (LOVENOX) injection  40 mg Subcutaneous Q24H  . magnesium sulfate 1 - 4 g  bolus IVPB  2 g Intravenous Once  . pneumococcal 23 valent vaccine  0.5 mL Intramuscular Tomorrow-1000  . potassium chloride  40 mEq Oral Q4H  . sodium chloride  1,000 mL Intravenous Once  . DISCONTD: enoxaparin (LOVENOX) injection  30 mg Subcutaneous Q24H   Continuous Infusions:   . sodium chloride 150 mL/hr at 12/14/11 1536    Principal Problem:  *ARF (acute renal failure) Active Problems:  SMOKER  Cramps, muscle, general  Hypokalemia  Dehydration    Time spent: > 30 mins   [facility]  [facility] Pager [phone]. If 8PM-8AM, please contact night-coverage at [email or web address], password [facility] 12/14/2011, 4:21 PM  LOS: 1 day

## 2011-12-14 NOTE — Progress Notes (Signed)
Rx:  Brief Lovenox note  Pt wt=64 kg, CrCl~51 ml/min Adjusted Lovenox to 40mg  daily for DVT prophylaxis  Tony Keith 12/14/2011 4:45 AM

## 2011-12-15 LAB — CK TOTAL AND CKMB (NOT AT ARMC)
CK, MB: 5.4 ng/mL — ABNORMAL HIGH (ref 0.3–4.0)
Relative Index: 0.2 (ref 0.0–2.5)
Total CK: 2854 U/L — ABNORMAL HIGH (ref 7–232)

## 2011-12-15 LAB — BASIC METABOLIC PANEL
GFR calc Af Amer: >90 mL/min (ref >90)
GFR calc non Af Amer: 83 mL/min — ABNORMAL LOW (ref >90)
Glucose, Bld: 102 mg/dL — ABNORMAL HIGH (ref 70–99)
Potassium: 4.5 mEq/L (ref 3.5–5.1)
Sodium: 139 mEq/L (ref 135–145)

## 2011-12-15 LAB — CBC
Hemoglobin: 12.7 g/dL — ABNORMAL LOW (ref 13.0–17.0)
RBC: 4.31 MIL/uL (ref 4.22–5.81)
WBC: 4.8 10*3/uL (ref 4.0–10.5)

## 2011-12-15 LAB — MAGNESIUM: Magnesium: 2.1 mg/dL (ref 1.5–2.5)

## 2011-12-15 MED ORDER — SODIUM CHLORIDE 0.9 % IV BOLUS (SEPSIS)
1000.0000 mL | Freq: Once | INTRAVENOUS | Status: AC
  Administered 2011-12-15: 1000 mL via INTRAVENOUS

## 2011-12-15 NOTE — Discharge Summary (Signed)
Physician Discharge Summary  Tony Keith RUE:[id number] DOB: 1961-02-22 DOA: 12/13/2011  PCP: No primary provider on file.  Admit date: 12/13/2011 Discharge date: 12/15/2011  Recommendations for Outpatient Follow-up:  1. Patient is to pick a PCP and follow up one week post discharge followup patient will need a basic metabolic profile done to followup on his renal function. Patient will also need a total CK done to followup on his creatinine kinase. 2. Patient will need a CT scan of the chest to followup on pulmonary nodules in 3 months and further evaluation of circumferential bladder wall thickening.  Discharge Diagnoses:  Principal Problem:  *ARF (acute renal failure) Active Problems:  SMOKER  Cramps, muscle, general  Hypokalemia  Dehydration   Discharge Condition: Stable and improved  Diet recommendation: Regular  Filed Weights   12/14/11 0430  Weight: 64.5 kg (142 lb 3.2 oz)    History of present illness:  This is a 51 year old gentleman who states he has no past medical history. He states he was at work when he developed full body cramps. Patient states his cramps were severe and generalized. He states he had cramps in his neck, in his back, in his feet, in his legs, all over. He came to the ER. In the ER the patient's creatinine was found to be elevated. The patient denies any nausea, vomiting, diarrhea, fevers, chills,, burning urination. He reports no change in his urinary pattern. He does work in a [employer] and states he worked hard today in a hot environments. The patient does not frequently see a doctor, he has no knowledge of prior kidney issues. However, on reviewing labs from previous ER visits, there was evidence of mild chronic kidney disease in 2011.   Hospital Course:  #1 acute renal failure  On admission patient was noted to be in acute renal failure with a creatinine of 2.58. It was felt patient's acute renal failure was likely secondary to prerenal azotemia  secondary to dehydration in the presence of heat exhaustion and possible muscle breakdown. Patient was hydrated with IV fluids he was monitored and his renal function improved on a daily basis such that by day of discharge his acute renal failure had resolved. On day of discharge patient's creatinine was down to 1.03 and he was in stable and improved condition. Patient will need to pick a PCP to followup with one week post discharge with a repeat basic metabolic profile need to be done to followup on his renal function.  #2 generalized muscle cramps Patient had presented to the ED with generalized muscle cramps which was felt to be secondary to heat exhaustion and probable muscle breakdown. Patient did have elevated total CK and was hydrated with IV fluids. EKG which was done showed no ST-T wave abnormalities. Patient denied any chest pain or shortness of breath. Initial total CK on admission was 1141 which then went up to 2863 and subsequently started to trend back down at 2854 on day of discharge. Patient was hydrated with IV fluids with clinical improvement such that by day of discharge patient denied any generalized muscle cramps. Patient also did have a potassium of 3.3 which was repleted and a magnesium of 1.7 which was repleted. By day of discharge patient was in stable and improved condition denied any further generalized muscle cramps on be discharged in stable and improved condition.  #3 dehydration During the hospitalization the patient on admission was noted to be dehydrated patient was hydrated with IV fluids and was euvolemic by day  of discharge.  #4 hypokalemia Patient was noted to be hypokalemic during the hospitalization his potassium was repleted and his hypokalemia had resolved by day of discharge.  #5 right middle lobe nodule per CT scan/circumferential bladder wall thickening CT scan which was obtained on admission of the abdomen and pelvis showed clustered right middle lobe nodules  and a nonspecific finding as well as circumferential bladder wall thickening with incomplete bladder distention. Patient was asymptomatic. Patient will need a repeat CT of the chest done in 3 months to followup on these nodules and will need outpatient followup on his bladder wall thickening.   The rest of patient's chronic medical issues remained stable throughout the hospitalization the patient was discharged in stable and improved condition.  Procedures:  CT of the abdomen and pelvis 12/13/2011  Consultations:  None  Discharge Exam: Filed Vitals:   12/15/11 1059  BP: 118/81  Pulse: 60  Temp: 98.5 F (36.9 C)  Resp: 18   Filed Vitals:   12/14/11 1405 12/14/11 2103 12/15/11 0525 12/15/11 1059  BP: 121/73 108/63 102/72 118/81  Pulse: 79 73 64 60  Temp: 97.9 F (36.6 C) 98.6 F (37 C) 98.1 F (36.7 C) 98.5 F (36.9 C)  TempSrc: Oral Oral Oral Oral  Resp: 16 18 16 18   Height:      Weight:      SpO2: 98% 100% 99% 100%    General: NAD Cardiovascular: RRR Respiratory: CTAB  Discharge Instructions  Discharge Orders    Future Orders Please Complete By Expires   Diet general      Increase activity slowly      Discharge instructions      Comments:   Patient to find a PCP and follow up in 1 week.     Medication List  As of 12/15/2011 12:57 PM     Follow-up Information    Please follow up. (f/u with PCP in 1 week)           The results of significant diagnostics from this hospitalization (including imaging, microbiology, ancillary and laboratory) are listed below for reference.    Significant Diagnostic Studies: Ct Abdomen Pelvis Wo Contrast  12/13/2011  *RADIOLOGY REPORT*  Clinical Data: Right-sided abdominal pain  CT ABDOMEN AND PELVIS WITHOUT CONTRAST  Technique:  Multidetector CT imaging of the abdomen and pelvis was performed following the standard protocol without intravenous contrast.  Comparison: None.  Findings: Clustered 304 mm nodules within the right  middle lobe heart size within normal limits.  Organ abnormality/lesion detection is limited in the absence of intravenous contrast. Within this limitation, unremarkable liver, biliary system, spleen, pancreas, adrenal glands, kidneys.  No hydronephrosis or hydroureter.  No urinary tract calculi.  No bowel obstruction.  No CT evidence for colitis.  Normal appendix.  No free intraperitoneal air or fluid.  No lymphadenopathy.  Minimal scattered atherosclerotic disease of the aorta and branch vessels.  Normal caliber.  Circumferential bladder wall thickening nonspecific given incomplete distension.  No acute osseous finding.  IMPRESSION: Clustered right middle lobe nodules is a nonspecific finding often infectious or inflammatory.  Recommend 52-month chest CT follow-up.  Circumferential bladder wall thickening is nonspecific given incomplete distension.  Correlate with urinalysis and cystoscopy if warranted.  Otherwise, within limitations of a unenhanced examination, no acute abnormality identified within the abdomen pelvis.   Original Report Authenticated By: Waneta Martins, M.D.     Microbiology: No results found for this or any previous visit (from the past 240 hour(s)).  Labs: Basic Metabolic Panel:  Lab 12/15/11 3086 12/15/11 0500 12/14/11 0650 12/13/11 2350 12/13/11 2340 12/13/11 1837  NA 139 -- 137 -- 135 136  K 4.5 -- 3.3* -- 4.0 3.6  CL 111 -- 104 -- 103 95*  CO2 21 -- 23 -- 17* 22  GLUCOSE 102* -- 130* -- 102* 179*  BUN 13 -- 17 -- 20 24*  CREATININE 1.03 -- 1.14 -- 1.51* 2.58*  CALCIUM 8.5 -- 8.9 -- 8.7 10.2  MG -- 2.1 -- 1.7 -- --  PHOS -- -- -- 3.2 -- --   Liver Function Tests:  Lab 12/14/11 0650 12/13/11 1837  AST 27 15  ALT 11 10  ALKPHOS 65 88  BILITOT 0.4 0.4  PROT 6.3 8.6*  ALBUMIN 3.5 5.0    Lab 12/13/11 1837  LIPASE 24  AMYLASE --   No results found for this basename: AMMONIA:5 in the last 168 hours CBC:  Lab 12/15/11 0501 12/14/11 0650 12/13/11 1837  WBC  4.8 9.5 12.1*  NEUTROABS -- -- 8.0*  HGB 12.7* 13.5 17.0  HCT 35.9* 37.7* 45.9  MCV 83.3 81.6 82.0  PLT 219 223 259   Cardiac Enzymes:  Lab 12/15/11 0501 12/14/11 0650 12/13/11 2340  CKTOTAL 2854* 2863* 1141*  CKMB 5.4* -- --  CKMBINDEX -- -- --  TROPONINI -- -- --   BNP: BNP (last 3 results) No results found for this basename: PROBNP:3 in the last 8760 hours CBG: No results found for this basename: GLUCAP:5 in the last 168 hours  Time coordinating discharge: 60 minutes  Signed:  THOMPSON,DANIEL  [facility] 12/15/2011, 12:57 PM

## 2011-12-15 NOTE — Progress Notes (Signed)
Patient given all discharge instructions. Expressed understanding. Called cab for transportation.

## 2012-02-09 ENCOUNTER — Emergency Department (HOSPITAL_COMMUNITY)
Admission: EM | Admit: 2012-02-09 | Discharge: 2012-02-09 | Disposition: A | Discharge status: Home / Self Care | Payer: No Typology Code available for payment source | Attending: Emergency Medicine | Admitting: Emergency Medicine

## 2012-02-09 ENCOUNTER — Emergency Department (HOSPITAL_COMMUNITY): Payer: No Typology Code available for payment source

## 2012-02-09 ENCOUNTER — Encounter (HOSPITAL_COMMUNITY): Payer: Self-pay | Admitting: Emergency Medicine

## 2012-02-09 DIAGNOSIS — Z7982 Long term (current) use of aspirin: Secondary | ICD-10-CM | POA: Insufficient documentation

## 2012-02-09 DIAGNOSIS — N289 Disorder of kidney and ureter, unspecified: Secondary | ICD-10-CM | POA: Insufficient documentation

## 2012-02-09 DIAGNOSIS — X58XXXA Exposure to other specified factors, initial encounter: Secondary | ICD-10-CM | POA: Insufficient documentation

## 2012-02-09 DIAGNOSIS — S90929A Unspecified superficial injury of unspecified foot, initial encounter: Secondary | ICD-10-CM | POA: Insufficient documentation

## 2012-02-09 DIAGNOSIS — F17200 Nicotine dependence, unspecified, uncomplicated: Secondary | ICD-10-CM | POA: Insufficient documentation

## 2012-02-09 DIAGNOSIS — L988 Other specified disorders of the skin and subcutaneous tissue: Secondary | ICD-10-CM

## 2012-02-09 DIAGNOSIS — Y929 Unspecified place or not applicable: Secondary | ICD-10-CM | POA: Insufficient documentation

## 2012-02-09 DIAGNOSIS — S90936A Unspecified superficial injury of unspecified lesser toe(s), initial encounter: Secondary | ICD-10-CM | POA: Insufficient documentation

## 2012-02-09 DIAGNOSIS — Y939 Activity, unspecified: Secondary | ICD-10-CM | POA: Insufficient documentation

## 2012-02-09 HISTORY — DX: Disorder of kidney and ureter, unspecified: N28.9

## 2012-02-09 LAB — BASIC METABOLIC PANEL
BUN: 13 mg/dL (ref 6–23)
Chloride: 103 mEq/L (ref 96–112)
GFR calc Af Amer: 87 mL/min — ABNORMAL LOW (ref >90)
Glucose, Bld: 96 mg/dL (ref 70–99)
Potassium: 4.1 mEq/L (ref 3.5–5.1)
Sodium: 139 mEq/L (ref 135–145)

## 2012-02-09 LAB — CBC
HCT: 41.3 % (ref 39.0–52.0)
Hemoglobin: 14.8 g/dL (ref 13.0–17.0)
WBC: 5.8 10*3/uL (ref 4.0–10.5)

## 2012-02-09 MED ORDER — CEPHALEXIN 500 MG PO CAPS: 1000.0000 mg | ORAL_CAPSULE | Freq: Two times a day (BID) | ORAL | Status: DC

## 2012-02-09 NOTE — ED Notes (Signed)
Pt reports a wound between his R 3rd, 4th and 5th toe. Pt reports that he has been seen here before for the same. Pt states that he has been treating at home with gauze and ointment,but does not know what kind. Pt denies DM. Pt in NAD and A&Ox4

## 2012-02-09 NOTE — ED Provider Notes (Signed)
Medical screening examination/treatment/procedure(s) were performed by non-physician practitioner and as supervising physician I was immediately available for consultation/collaboration.   Celene Kras, MD 02/09/12 [phone]

## 2012-02-09 NOTE — ED Provider Notes (Signed)
History     CSN: [id number]  Arrival date & time 02/09/12  1807   First MD Initiated Contact with Patient 02/09/12 1831      Chief Complaint  Patient presents with  . Toe Wound     Between R 3rd, 4th and 5th toe    (Consider location/radiation/quality/duration/timing/severity/associated sxs/prior treatment) HPI  Onecimo Purchase is a 51 y.o. male complaining of sore between the right third and fourth and fourth and fifth toes worsening over the course of the week. Patient has been putting ointment on with no relief. He denies pain. States that it itches. She had similar episode approximately 6 months ago. Did not follow with wound care clinic. Denies history of diabetes however patient has no primary care doctor she is uninsured at this time. Patient denies history of a blister breaking. He denies soaking his feet or keeping them once. He states that he tries to  The affected area several times a day.  Past Medical History  Diagnosis Date  . Renal disorder     History reviewed. No pertinent past surgical history.  No family history on file.  History  Substance Use Topics  . Smoking status: Current Every Day Smoker -- 1.0 packs/day    Types: Cigarettes  . Smokeless tobacco: Not on file  . Alcohol Use: No      Review of Systems  Constitutional: Negative for fever.  Respiratory: Negative for shortness of breath.   Cardiovascular: Negative for chest pain.  Gastrointestinal: Negative for nausea, vomiting, abdominal pain and diarrhea.  Skin: Positive for wound.  All other systems reviewed and are negative.    Allergies  Review of patient's allergies indicates no known allergies.  Home Medications   Current Outpatient Rx  Name Route Sig Dispense Refill  . ASPIRIN 325 MG PO TABS Oral Take 325 mg by mouth every 4 (four) hours as needed. pain      BP 107/74  Pulse 82  Temp 98.3 F (36.8 C) (Oral)  Resp 16  SpO2 97%  Physical Exam  Nursing note and vitals  reviewed. Constitutional: He is oriented to person, place, and time. He appears well-developed and well-nourished. No distress.  HENT:  Head: Normocephalic.  Eyes: Conjunctivae normal and EOM are normal.  Cardiovascular: Normal rate.   Pulmonary/Chest: Effort normal. No stridor.  Musculoskeletal: Normal range of motion.  Neurological: He is alert and oriented to person, place, and time.  Skin:       Significant maceration between the third and fourth digits of the right toe. 2 mm maceration to the medial fifth digit.  No induration, erythema, purulent discharge, or tenderness to palpation.  Psychiatric: He has a normal mood and affect.    ED Course  Procedures (including critical care time)  Labs Reviewed  BASIC METABOLIC PANEL - Abnormal; Notable for the following:    GFR calc non Af Amer 75 (*)     GFR calc Af Amer 87 (*)     All other components within normal limits  CBC   Dg Foot Complete Right  02/09/2012  *RADIOLOGY REPORT*  Clinical Data: Sores between third/fourth and fourth/fifth toes, evaluate for osteomyelitis  RIGHT FOOT COMPLETE - 3+ VIEW  Comparison: 10/01/2011  Findings: No fracture or dislocation is seen.  The joint spaces are preserved.  The visualized soft tissues are unremarkable.  No radiographic findings to suggest osteomyelitis.  IMPRESSION: No radiographic findings to suggest osteomyelitis.   Original Report Authenticated By: Charline Bills, M.D.  1. Maceration of skin       MDM  X-ray shows no no evidence of osteomyelitis. Blood work is otherwise normal I will start him on Keflex prophylactically although it does not appear that the wound is infected at this time. I will ask him to return for recheck in 48 hours.   Pt verbalized understanding and agrees with care plan. Outpatient follow-up and return precautions given.     New Prescriptions   CEPHALEXIN (KEFLEX) 500 MG CAPSULE    Take 2 capsules (1,000 mg total) by mouth 2 (two) times daily.           Wynetta Emery, PA-C 02/09/12 1955

## 2012-02-09 NOTE — ED Notes (Signed)
Patient given discharge instructions, information, prescriptions, and diet order. Patient states that they adequately understand discharge information given and to return to ED if symptoms return or worsen.    Pt informed to take all of his abx and to follow up with [facility] for further complaints.

## 2012-10-30 ENCOUNTER — Emergency Department (INDEPENDENT_AMBULATORY_CARE_PROVIDER_SITE_OTHER): Payer: PRIVATE HEALTH INSURANCE

## 2012-10-30 ENCOUNTER — Emergency Department (INDEPENDENT_AMBULATORY_CARE_PROVIDER_SITE_OTHER)
Admission: EM | Admit: 2012-10-30 | Discharge: 2012-10-30 | Disposition: A | Discharge status: Home / Self Care | Payer: Self-pay | Source: Home / Self Care | Attending: Family Medicine | Admitting: Family Medicine

## 2012-10-30 DIAGNOSIS — S60459A Superficial foreign body of unspecified finger, initial encounter: Secondary | ICD-10-CM

## 2012-10-30 MED ORDER — KETOROLAC TROMETHAMINE 60 MG/2ML IM SOLN
60.0000 mg | Freq: Once | INTRAMUSCULAR | Status: AC
  Administered 2012-10-30: 60 mg via INTRAMUSCULAR

## 2012-10-30 MED ORDER — NAPROXEN 500 MG PO TABS: 500.0000 mg | ORAL_TABLET | Freq: Two times a day (BID) | ORAL | Status: DC

## 2012-10-30 MED ORDER — KETOROLAC TROMETHAMINE 30 MG/ML IJ SOLN
INTRAMUSCULAR | Status: AC
  Filled 2012-10-30: qty 1

## 2012-10-30 MED ORDER — DOXYCYCLINE HYCLATE 100 MG PO CAPS: 100.0000 mg | ORAL_CAPSULE | Freq: Two times a day (BID) | ORAL | Status: DC

## 2012-10-30 MED ORDER — TETANUS-DIPHTH-ACELL PERTUSSIS 5-2.5-18.5 LF-MCG/0.5 IM SUSP
0.5000 mL | Freq: Once | INTRAMUSCULAR | Status: AC
  Administered 2012-10-30: 0.5 mL via INTRAMUSCULAR

## 2012-10-30 MED ORDER — TETANUS-DIPHTH-ACELL PERTUSSIS 5-2.5-18.5 LF-MCG/0.5 IM SUSP
INTRAMUSCULAR | Status: AC
  Filled 2012-10-30: qty 0.5

## 2012-10-30 MED ORDER — TRAMADOL HCL 50 MG PO TABS: 50.0000 mg | ORAL_TABLET | Freq: Four times a day (QID) | ORAL | Status: DC | PRN

## 2012-10-30 NOTE — ED Provider Notes (Signed)
History    CSN: [id number] Arrival date & time 10/30/12  1521  First MD Initiated Contact with Patient 10/30/12 1610     Chief Complaint  Patient presents with  . Finger Injury   (Consider location/radiation/quality/duration/timing/severity/associated sxs/prior Treatment) HPI Comments: 52 year old male presents complaining of having a foreign object embedded in his finger. He was cleaning a car at work today when a piece the cars rim somehow broke off and stabbed into the end of his left middle finger through the fingernail. Since that time, he has had some pain and bleeding in the finger. He denies any fever, chills, or numbness distal to the injury. His last tetanus vaccine was less than 2 years ago.  Past Medical History  Diagnosis Date  . Renal disorder    No past surgical history on file. No family history on file. History  Substance Use Topics  . Smoking status: Current Every Day Smoker -- 1.00 packs/day    Types: Cigarettes  . Smokeless tobacco: Not on file  . Alcohol Use: No    Review of Systems  Constitutional: Negative for fever, chills and fatigue.  HENT: Negative for sore throat, neck pain and neck stiffness.   Eyes: Negative for visual disturbance.  Respiratory: Negative for cough and shortness of breath.   Cardiovascular: Negative for chest pain, palpitations and leg swelling.  Gastrointestinal: Negative for nausea, vomiting, abdominal pain, diarrhea and constipation.  Genitourinary: Negative for dysuria, urgency, frequency and hematuria.  Musculoskeletal: Negative for myalgias and arthralgias.  Skin: Positive for wound. Negative for rash.  Neurological: Negative for dizziness, weakness and light-headedness.    Allergies  Review of patient's allergies indicates no known allergies.  Home Medications   Current Outpatient Rx  Name  Route  Sig  Dispense  Refill  . aspirin 325 MG tablet   Oral   Take 325 mg by mouth every 4 (four) hours as needed. pain          . cephALEXin (KEFLEX) 500 MG capsule   Oral   Take 2 capsules (1,000 mg total) by mouth 2 (two) times daily.   28 capsule   0   . doxycycline (VIBRAMYCIN) 100 MG capsule   Oral   Take 1 capsule (100 mg total) by mouth 2 (two) times daily.   14 capsule   0   . naproxen (NAPROSYN) 500 MG tablet   Oral   Take 1 tablet (500 mg total) by mouth 2 (two) times daily.   60 tablet   0   . traMADol (ULTRAM) 50 MG tablet   Oral   Take 1 tablet (50 mg total) by mouth every 6 (six) hours as needed for pain.   20 tablet   0    BP 129/90  Pulse 82  Temp(Src) 98.9 F (37.2 C) (Oral)  Resp 20  SpO2 96% Physical Exam  Nursing note and vitals reviewed. Constitutional: He is oriented to person, place, and time. He appears well-developed and well-nourished. No distress.  HENT:  Head: Normocephalic and atraumatic.  Neck: Normal range of motion. Neck supple. No tracheal deviation present.  Musculoskeletal: Normal range of motion.       Left hand: He exhibits tenderness. He exhibits normal capillary refill. Normal sensation noted.  Neurological: He is alert and oriented to person, place, and time. No cranial nerve deficit. Coordination normal.  Skin: Skin is warm and dry. Laceration (puncture wound directly through the nail of the left middle finger with some bleeding. No obvious foreign  body) noted. He is not diaphoretic.  Psychiatric: He has a normal mood and affect. Judgment normal.    ED Course  Procedures (including critical care time) Labs Reviewed - No data to display Dg Finger Middle Left  10/30/2012   *RADIOLOGY REPORT*  Clinical Data: Stuck piece of metal under left middle finger  LEFT MIDDLE FINGER 2+V  Comparison: None.  Findings:  There is a linear radiopaque material regional to the nail bed of the middle phalanx of the left hand.  This finding without associated fracture or discrete area of osteolysis to suggest osteomyelitis.  No subcutaneous emphysema.  Joint spaces  are preserved.  No erosions.  IMPRESSION: Linear radiopaque foreign body regional to the left middle finger nail bed compatible with provided history. No associated fracture or definitive evidence of osteomyelitis.   Original Report Authenticated By: Tacey Ruiz, MD   1. Foreign body of finger of left hand, initial encounter    IM Toradol and digital block with 2% lidocaine without epinephrine for pain control  MDM  Case discussed with attending and with patient. Given the option, he elects to not have the fingernail removed to attempt to remove this foreign body. He will keep it clean and covered and will follow up with hand surgeon. We'll give prophylactic antibiotics as well.   Meds ordered this encounter  Medications  . ketorolac (TORADOL) injection 60 mg    Sig:   . doxycycline (VIBRAMYCIN) 100 MG capsule    Sig: Take 1 capsule (100 mg total) by mouth 2 (two) times daily.    Dispense:  14 capsule    Refill:  0  . naproxen (NAPROSYN) 500 MG tablet    Sig: Take 1 tablet (500 mg total) by mouth 2 (two) times daily.    Dispense:  60 tablet    Refill:  0  . traMADol (ULTRAM) 50 MG tablet    Sig: Take 1 tablet (50 mg total) by mouth every 6 (six) hours as needed for pain.    Dispense:  20 tablet    Refill:  0  . TDaP (BOOSTRIX) injection 0.5 mL    Sig:      Graylon Good, PA-C 10/30/12 1731

## 2012-10-30 NOTE — ED Notes (Signed)
Patient states he cleaning a cracked rim when a piece of the rim got stuck in his finger ... Left middle finger.

## 2012-11-01 NOTE — ED Provider Notes (Signed)
Medical screening examination/treatment/procedure(s) were performed by resident physician or non-physician practitioner and as supervising physician I was immediately available for consultation/collaboration.   KINDL,JAMES DOUGLAS MD.   James D Kindl, MD 11/01/12 0931 

## 2012-11-08 ENCOUNTER — Ambulatory Visit: Payer: Self-pay | Admitting: Emergency Medicine

## 2012-11-08 VITALS — BP 124/74 | HR 85 | Temp 98.0°F | Resp 17 | Ht 62.5 in | Wt 147.0 lb

## 2012-11-08 DIAGNOSIS — L293 Anogenital pruritus, unspecified: Secondary | ICD-10-CM

## 2012-11-08 MED ORDER — FLUCONAZOLE 100 MG PO TABS: 150.0000 mg | ORAL_TABLET | Freq: Every day | ORAL | Status: AC

## 2012-11-08 NOTE — Progress Notes (Signed)
[facility] [address] [place] [address] [phone]  Date:  11/08/2012   Name:  Tony Keith   DOB:  Jan 09, 1961   MRN:  [id number]  PCP:  No PCP Per Patient    Chief Complaint: Penis Pain   History of Present Illness:  Tony Keith is a 52 y.o. very pleasant male patient who presents with the following:  Noticed itchy lesions on the glans and foreskin a week ago.  He has no history of painful eruption or discharge or dysuria.  No ulcerations. Says he has not been exposed to STD as he is monogamous.  No improvement with over the counter medications or other home remedies. Denies other complaint or health concern today.   Patient Active Problem List   Diagnosis Date Noted  . Cramps, muscle, general 12/14/2011  . Hypokalemia 12/14/2011  . Dehydration 12/14/2011  . ARF (acute renal failure) 12/13/2011  . SMOKER 01/20/2010  . CELLULITIS, FOOT, RIGHT 01/20/2010    Past Medical History  Diagnosis Date  . Renal disorder     History reviewed. No pertinent past surgical history.  History  Substance Use Topics  . Smoking status: Current Every Day Smoker -- 1.00 packs/day for 10 years    Types: Cigarettes  . Smokeless tobacco: Not on file  . Alcohol Use: No    History reviewed. No pertinent family history.  No Known Allergies  Medication list has been reviewed and updated.  No current outpatient prescriptions on file prior to visit.   No current facility-administered medications on file prior to visit.    Review of Systems:  As per HPI, otherwise negative.    Physical Examination: Filed Vitals:   11/08/12 1008  BP: 124/74  Pulse: 85  Temp: 98 F (36.7 C)  Resp: 17   Filed Vitals:   11/08/12 1008  Height: 5' 2.5" (1.588 m)  Weight: 147 lb (66.679 kg)   Body mass index is 26.44 kg/(m^2). Ideal Body Weight: Weight in (lb) to have BMI = 25: 138.6   GEN: WDWN, NAD, Non-toxic, Alert & Oriented x 3 HEENT: Atraumatic, Normocephalic.   Ears and Nose: No external deformity. EXTR: No clubbing/cyanosis/edema NEURO: Normal gait.  PSYCH: Normally interactive. Conversant. Not depressed or anxious appearing.  Calm demeanor.  GENITALIA:  Normal male uncircumcised.  Has numerous papular lesions on his glans and undersurface of his foreskin.  Assessment and Plan: Candidiasis Diflucan Herpes culture   Signed,  Phillips Odor, MD

## 2012-11-08 NOTE — Patient Instructions (Signed)
Sexually Transmitted Disease  Sexually transmitted disease (STD) refers to any infection that is passed from person to person during sexual activity. This may happen by way of saliva, semen, blood, vaginal mucus, or urine. Common STDs include:   Gonorrhea.   Chlamydia.   Syphilis.   HIV/AIDS.   Genital herpes.   Hepatitis B and C.   Trichomonas.   Human papillomavirus (HPV).   Pubic lice.  CAUSES   An STD may be spread by bacteria, virus, or parasite. A person can get an STD by:   Sexual intercourse with an infected person.   Sharing sex toys with an infected person.   Sharing needles with an infected person.   Having intimate contact with the genitals, mouth, or rectal areas of an infected person.  SYMPTOMS   Some people may not have any symptoms, but they can still pass the infection to others. Different STDs have different symptoms. Symptoms include:   Painful or bloody urination.   Pain in the pelvis, abdomen, vagina, anus, throat, or eyes.   Skin rash, itching, irritation, growths, or sores (lesions). These usually occur in the genital or anal area.   Abnormal vaginal discharge.   Penile discharge in men.   Soft, flesh-colored skin growths in the genital or anal area.   Fever.   Pain or bleeding during sexual intercourse.   Swollen glands in the groin area.   Yellow skin and eyes (jaundice). This is seen with hepatitis.  DIAGNOSIS   To make a diagnosis, your caregiver may:   Take a medical history.   Perform a physical exam.   Take a specimen (culture) to be examined.   Examine a sample of discharge under a microscope.   Perform blood tests.   Perform a Pap test, if this applies.   Perform a colposcopy.   Perform a laparoscopy.  TREATMENT    Chlamydia, gonorrhea, trichomonas, and syphilis can be cured with antibiotic medicine.   Genital herpes, hepatitis, and HIV can be treated, but not cured, with prescribed medicines. The medicines will lessen the symptoms.   Genital warts  from HPV can be treated with medicine or by freezing, burning (electrocautery), or surgery. Warts may come back.   HPV is a virus and cannot be cured with medicine or surgery.However, abnormal areas may be followed very closely by your caregiver and may be removed from the cervix, vagina, or vulva through office procedures or surgery.  If your diagnosis is confirmed, your recent sexual partners need treatment. This is true even if they are symptom-free or have a negative culture or evaluation. They should not have sex until their caregiver says it is okay.  HOME CARE INSTRUCTIONS   All sexual partners should be informed, tested, and treated for all STDs.   Take your antibiotics as directed. Finish them even if you start to feel better.   Only take over-the-counter or prescription medicines for pain, discomfort, or fever as directed by your caregiver.   Rest.   Eat a balanced diet and drink enough fluids to keep your urine clear or pale yellow.   Do not have sex until treatment is completed and you have followed up with your caregiver. STDs should be checked after treatment.   Keep all follow-up appointments, Pap tests, and blood tests as directed by your caregiver.   Only use latex condoms and water-soluble lubricants during sexual activity. Do not use petroleum jelly or oils.   Avoid alcohol and illegal drugs.   Get vaccinated   for HPV and hepatitis. If you have not received these vaccines in the past, talk to your caregiver about whether one or both might be right for you.   Avoid risky sex practices that can break the skin.  The only way to avoid getting an STD is to avoid all sexual activity.Latex condoms and dental dams (for oral sex) will help lessen the risk of getting an STD, but will not completely eliminate the risk.  SEEK MEDICAL CARE IF:    You have a fever.   You have any new or worsening symptoms.  Document Released: 06/23/2002 Document Revised: 06/25/2011 Document Reviewed:  06/30/2010  [employer] Patient Information 2014 [employer]

## 2012-11-17 LAB — HERPES CULTURE, RAPID

## 2013-07-26 ENCOUNTER — Emergency Department (HOSPITAL_COMMUNITY)
Admission: EM | Admit: 2013-07-26 | Discharge: 2013-07-26 | Disposition: A | Discharge status: Home / Self Care | Payer: Self-pay | Attending: Emergency Medicine | Admitting: Emergency Medicine

## 2013-07-26 ENCOUNTER — Encounter (HOSPITAL_COMMUNITY): Payer: Self-pay | Admitting: Emergency Medicine

## 2013-07-26 DIAGNOSIS — F17200 Nicotine dependence, unspecified, uncomplicated: Secondary | ICD-10-CM | POA: Insufficient documentation

## 2013-07-26 DIAGNOSIS — S335XXA Sprain of ligaments of lumbar spine, initial encounter: Secondary | ICD-10-CM | POA: Insufficient documentation

## 2013-07-26 DIAGNOSIS — Y9389 Activity, other specified: Secondary | ICD-10-CM | POA: Insufficient documentation

## 2013-07-26 DIAGNOSIS — N289 Disorder of kidney and ureter, unspecified: Secondary | ICD-10-CM | POA: Insufficient documentation

## 2013-07-26 DIAGNOSIS — X500XXA Overexertion from strenuous movement or load, initial encounter: Secondary | ICD-10-CM | POA: Insufficient documentation

## 2013-07-26 DIAGNOSIS — Y92009 Unspecified place in unspecified non-institutional (private) residence as the place of occurrence of the external cause: Secondary | ICD-10-CM | POA: Insufficient documentation

## 2013-07-26 DIAGNOSIS — S39012A Strain of muscle, fascia and tendon of lower back, initial encounter: Secondary | ICD-10-CM

## 2013-07-26 MED ORDER — HYDROCODONE-ACETAMINOPHEN 5-325 MG PO TABS: 1.0000 | ORAL_TABLET | ORAL | Status: AC | PRN

## 2013-07-26 MED ORDER — IBUPROFEN 800 MG PO TABS: 800.0000 mg | ORAL_TABLET | Freq: Three times a day (TID) | ORAL | Status: DC

## 2013-07-26 NOTE — ED Notes (Signed)
Pt reports low back pain 4 days after lifting heavy trash barrel. Pain unresponsive to motrin

## 2013-07-26 NOTE — ED Provider Notes (Signed)
Medical screening examination/treatment/procedure(s) were performed by non-physician practitioner and as supervising physician I was immediately available for consultation/collaboration.   EKG Interpretation None        Tony PoundMichael Y. Laqueta Bonaventura, MD 07/26/13 1252

## 2013-07-26 NOTE — Discharge Instructions (Signed)
Call for a follow up appointment with a Family or Primary Care Provider.  °Return if Symptoms worsen.   °Take medication as prescribed.  ° ° °Emergency Department Resource Guide °1) Find a Doctor and Pay Out of Pocket °Although you won't have to find out who is covered by your insurance plan, it is a good idea to ask around and get recommendations. You will then need to call the office and see if the doctor you have chosen will accept you as a new patient and what types of options they offer for patients who are self-pay. Some doctors offer discounts or will set up payment plans for their patients who do not have insurance, but you will need to ask so you aren't surprised when you get to your appointment. ° °2) Contact Your Local Health Department °Not all health departments have doctors that can see patients for sick visits, but many do, so it is worth a call to see if yours does. If you don't know where your local health department is, you can check in your phone book. The CDC also has a tool to help you locate your state's health department, and many state websites also have listings of all of their local health departments. ° °3) Find a Walk-in Clinic °If your illness is not likely to be very severe or complicated, you may want to try a walk in clinic. These are popping up all over the country in pharmacies, drugstores, and shopping centers. They're usually staffed by nurse practitioners or physician assistants that have been trained to treat common illnesses and complaints. They're usually fairly quick and inexpensive. However, if you have serious medical issues or chronic medical problems, these are probably not your best option. ° °No Primary Care Doctor: °- Call Health Connect at  832-8000 - they can help you locate a primary care doctor that  accepts your insurance, provides certain services, etc. °- Physician Referral Service- 1-800-533-3463 ° °Chronic Pain Problems: °Organization         Address  Phone    Notes  °[facility]  (336) 297-2271 Patients need to be referred by their primary care doctor.  ° °Medication Assistance: °Organization         Address  Phone   Notes  °[place] [facility] [address] °[place], [place] [address] (336) 641-8030 --Must be a resident of [place] °-- Must have NO insurance coverage whatsoever (no Medicaid/ Medicare, etc.) °-- The pt. MUST have a primary care doctor that directs their care regularly and follows them in the community °  °[facility]  (866) 331-1348   °[employer]  (888) 892-1162   ° °Agencies that provide inexpensive medical care: °Organization         Address  Phone   Notes  °[facility]  (336) 832-8035   °[facility]    (336) 832-7272   °[facility] [address] °[place], [place] [address] (336) 832-4777   °[facility] [address], °[place] (336) 271-4999   °[facility]    (336) 373-0678   °[facility]    (336) 272-1050   °[facility] ° [address], [place] Phone:  (336) 832-4444, Fax:  (336) 832-4440 Hours of Operation:  9 am - 6 pm, M-F.  Also accepts Medicaid/Medicare and self-pay.  °[facility] ° [address], [place]   Phone: (336) 832-3150, Fax: (336) 832-3151. Hours of Operation:  8:30 am - 5:30 pm, M-F.  Also accepts Medicaid and self-pay.  °[facility] [place] [address], [place] Phone: (336) 878-6027   °[facility] [address], [place], [place] (336)723-1848, Ext. 123 Mondays & Thursdays: 7-9 AM.  First 15 patients are seen on a first come, first serve basis. °  ° °Medicaid-accepting [place] Providers: ° °Organization         Address  Phone   Notes  °[facility] [address], [place] (336) 641-2100 Also accepts self-pay patients.  °[facility]  [address], [place] ° (336) 856-9996   °[facility] [address], [place] (336) 288-8857   °[facility] [address] [place] [address], [place] (336) 299-7000   °Veita Bland [address], [place]  ° (336) 373-1557 Only accepts [place] Access Medicaid patients after they have their name applied to their card.  ° °Self-Pay (no insurance) in [place]: ° °Organization         Address  Phone   Notes  °Sickle Cell Patients, [facility] [address], [place] (336) 832-1970   °[facility] [address], [place] (336) 832-4400   °[facility] ° [address], [place] (336) 992-4800   °[facility]/Dr. Osei-Bonsu ° [address] [place] [address], [place] or [address], [place] (336) 841-8500 Phone number for both [place] and [place] locations is the same.  °[facility] [address], [place] (336) 299-0000   °[facility] [address] [place] [address], [place] or [address] (336) 852-7530 °(336) 878-2260   °[facility] [address], [place] (336) 350-1642, phone; (336) 294-5005, fax Sees patients 1st and 3rd Saturday of every month.  Must not qualify for public or private insurance (i.e. Medicaid, Medicare, [place] Health Choice, Veterans' Benefits) • Household income should be no more than 200% of the poverty level •The clinic cannot treat you if you are pregnant or think you are pregnant • Sexually transmitted diseases are not treated at the clinic.  ° ° °Dental Care: °Organization         Address  Phone  Notes  °[facility] [facility] [address], [place] (336) 641-6152 Accepts children up to age 21 who are enrolled in Medicaid or [place] Health Choice; pregnant women with a Medicaid card; and children who have  applied for Medicaid or [place] Health Choice, but were declined, whose parents can pay a reduced fee at time of service.  °[facility] [place]  [address], [place] (336) 641-7733 Accepts children up to age 21 who are enrolled in Medicaid or [place] Health Choice; pregnant women with a Medicaid card; and children who have applied for Medicaid or [place] Health Choice, but were declined, whose parents can pay a reduced fee at time of service.  °[facility] ° [address],  (336) 641-4533 Patients are seen by appointment only. Walk-ins are not accepted. [facility] will see patients 18 years of age and older. °Monday - Tuesday (8am-5pm) °Most Wednesdays (8:30-5pm) °$30 per visit, cash only  °[facility] ° [address], [place] (336) 641-4533 Patients are seen by appointment only. Walk-ins are not accepted. [facility] will see patients 18 years of age and older. °One Wednesday Evening (Monthly: Volunteer Based).  $30 per visit, cash only  °[facility]  (919) 537-3737 for adults; Children under age 4, call Graduate Pediatric Dentistry at (919) [phone]. Children aged 4-14, please call (919) 537-3737 to request a pediatric application. ° Dental services are provided in all areas of dental care including fillings, crowns and bridges, complete and partial dentures, implants, gum treatment, root canals, and extractions. Preventive care is also provided. Treatment is provided to both adults and children. °Patients are selected via a lottery and there is often a waiting list. °  °[facility] [address], °[place] ° (336) 763-8833 [email or web address] °  °[facility] [address], [place], [place] (336)723-1848, Ext. 123 Second and Fourth Thursday of each month, opens at 6:30 AM; Clinic ends at 9 AM.  Patients are seen on a first-come first-served basis, and a  limited number are seen during each clinic.  ° °[facility] ° [address], [place], [place] (336) 723-7904   Eligibility Requirements °You must have lived in [place], [place], or [place] counties for at least the last three months. °  You cannot be eligible for state or federal sponsored healthcare insurance, including Veterans Administration, Medicaid, or Medicare. °  You generally cannot be eligible for healthcare insurance through your employer.  °  How to apply: °Eligibility screenings are held every Tuesday and Wednesday afternoon from 1:00 pm until 4:00 pm. You do not need an appointment for the interview!  °[facility] [address], [place], [place] 336-631-2330   °[facility]  336-342-8273   °[facility]  336-703-3100   °[facility]  336-570-6415   ° °Behavioral Health Resources in the Community: °Intensive Outpatient Programs °Organization         Address  Phone  Notes  °[facility] [address], [place], [place] 336-878-6098   °[facility] [address], [place], [place] 336-832-9800   °[facility] [address], [place], [place] ° 336-882-2125   °[facility] [address],  °[place], [place] 1-800-853-5163 or 336-641-4981   °Substance Abuse Resources °Organization         Address  Phone  Notes  °[facility]  336-882-2125   °[facility]  336-784-9470   °[facility]  336-285-9073   °[facility]  336-845-3988   °Residential & Outpatient Substance Abuse Program  1-800-659-3381   °Psychological Services °Organization         Address  Phone  Notes  °[facility]  336- 832-9600   °[facility]  336- 378-7881   °[facility] [address], [place] 1-800-853-5163 or 336-641-4981   ° °Mobile Crisis Teams °Organization          Address  Phone  Notes  °[facility], Mobile Crisis Care Unit  1-877-626-1772   °Assertive °[facility] ° [address] [place], [place] 336-834-9664   °Sharon DeEsch [address] °[place] 336-554-5454   ° °Self-Help/Support Groups °Organization         Address  Phone             Notes  °[facility] [place] - variety of   support groups  336- 373-1402 Call for more information  °Narcotics Anonymous (NA), [facility] [address], °[place]  2 meetings at this location  ° °Residential Treatment Programs °Organization         Address  Phone  Notes  °[facility] [address],    °[place]  1-866-801-8205   °[facility] ° [address], [place], [place] 704-293-8524   °[facility] [address], [place] 336-845-3988 Admissions: 8am-3pm M-F  °[facility] [address]    °[place], [place] 336-841-1104   °[facility] [address], [place], [place] 336-379-7146   °[facility] [address]  °[place], [place] 336-285-9073   °[facility] - Intensive Outpatient [address], [place], [place] 336-852-3033   °[facility] [address]  °[place], [place] 1-877-615-2722 or 336-784-9470   °[facility] [address] [place], [place] 336-227-7417 Accepts Medicaid  °[facility] [address]  °[place] 1-800-659-3381 Substance Abuse/Addiction Treatment  ° °[place] Behavioral Health Resources °Organization         Address  Phone  Notes  °[facility]  (888) 581-9988   °Julie Brannon, PhD [address] [place], [place]   (336) 349-5553 or (336) 951-0000   °[facility]   [address] °[place], [place] (336) 349-4454   °[facility] [address], [place], [place] (336) 342-8316 Insurance/Medicaid/sponsorship  through [facility]  °[facility] [address]                                    [place], [place] (336) 342-8316 Therapy/tele-psych/case  °[facility] [address]  ° [place], [place] (336) 349-2233    °Dr. Arfeen  (336) 349-4544   °[facility] [place]  [employer] [place] [facility] 1) [address], [place] °2) [address], [place] °3)  [address], [place] (336) 349-3220 °(336) 342-7768 ° °(336) 342-8140   °[place] [facility] (336) 342-1394 or (336) 342-3537 (After Hours)    ° °

## 2013-07-26 NOTE — ED Provider Notes (Signed)
CSN: [id number]     Arrival date & time 07/26/13  0947 History   First MD Initiated Contact with Patient 07/26/13 1007     Chief Complaint  Patient presents with  . Back Pain    4 day hx of low back pain after lifting large item     (Consider location/radiation/quality/duration/timing/severity/associated sxs/prior Treatment) HPI Comments: Tony BurdockJohn Keith is a 53 y.o. male with a past medical history of renal disorder presenting the Emergency Department with a chief complaint of back pain for 3 days.  The patient reports he was taking the trash out and dumping it into a dumpster when he felt the back pain.  He denies radiation to lower extremities.  He reports movement increase the discomfort and it "eases" off thorough out the day with activity.  He reports taking  Motrin 200 mg and tylenol for pain without full resolution of symptoms.   The history is provided by the patient. No language interpreter was used.    Past Medical History  Diagnosis Date  . Renal disorder    No past surgical history on file. No family history on file. History  Substance Use Topics  . Smoking status: Current Every Day Smoker -- 1.00 packs/day for 10 years    Types: Cigarettes  . Smokeless tobacco: Not on file  . Alcohol Use: No    Review of Systems  Constitutional: Negative for fever and chills.  Musculoskeletal: Positive for back pain. Negative for gait problem, neck pain and neck stiffness.  Neurological: Negative for weakness and numbness.      Allergies  Review of patient's allergies indicates no known allergies.  Home Medications   Current Outpatient Rx  Name  Route  Sig  Dispense  Refill  . fluconazole (DIFLUCAN) 100 MG tablet   Oral   Take 1.5 tablets (150 mg total) by mouth daily. Repeat if needed   14 tablet   0    BP 130/85  Pulse 59  Temp(Src) 97.5 F (36.4 C) (Oral)  Resp 18  Wt 140 lb (63.504 kg)  SpO2 100% Physical Exam  Nursing note and vitals reviewed. Constitutional:  He is oriented to person, place, and time. He appears well-developed and well-nourished. No distress.  HENT:  Head: Normocephalic and atraumatic.  Neck: Neck supple.  Pulmonary/Chest: Effort normal. No respiratory distress.  Musculoskeletal: Normal range of motion.       Lumbar back: He exhibits tenderness.       Back:  No midline T-spine, or L-spine tenderness with no step-offs, crepitus, or deformities noted.  Good and equal strength to bilateral lower extremities. No decrease sensation to light touch. Normal gait.   Neurological: He is alert and oriented to person, place, and time. No sensory deficit. He exhibits normal muscle tone.  Reflex Scores:      Patellar reflexes are 2+ on the right side and 2+ on the left side. Skin: Skin is warm and dry. No rash noted. He is not diaphoretic.  Psychiatric: He has a normal mood and affect. His behavior is normal.    ED Course  Procedures (including critical care time) Labs Review Labs Reviewed - No data to display Imaging Review No results found.   EKG Interpretation None      MDM   Final diagnoses:  Back strain   Patient with back pain, bilateral paraspinal muscles tender to palpation.  No neurological deficits and normal neuro exam.  Patient with normal gait.  No loss of bowel or bladder control.  No concern for cauda equina.  No fever, night sweats, weight loss, h/o cancer, IVDU.  RICE protocol and pain medicine indicated and discussed with patient. Resources given  Meds given in ED:  Medications - No data to display  New Prescriptions   HYDROCODONE-ACETAMINOPHEN (NORCO/VICODIN) 5-325 MG PER TABLET    Take 1 tablet by mouth every 4 (four) hours as needed.   IBUPROFEN (ADVIL,MOTRIN) 800 MG TABLET    Take 1 tablet (800 mg total) by mouth 3 (three) times daily. Take with meals      Clabe Seal, PA-C 07/26/13 1020

## 2019-05-12 ENCOUNTER — Other Ambulatory Visit: Payer: Self-pay

## 2019-05-12 ENCOUNTER — Ambulatory Visit (INDEPENDENT_AMBULATORY_CARE_PROVIDER_SITE_OTHER): Payer: PRIVATE HEALTH INSURANCE | Admitting: Registered Nurse

## 2019-05-12 ENCOUNTER — Encounter: Payer: Self-pay | Admitting: Registered Nurse

## 2019-05-12 VITALS — BP 131/89 | HR 77 | Temp 97.3°F | Ht 62.5 in | Wt 137.0 lb

## 2019-05-12 DIAGNOSIS — B009 Herpesviral infection, unspecified: Secondary | ICD-10-CM | POA: Insufficient documentation

## 2019-05-12 MED ORDER — VALACYCLOVIR HCL 1 G PO TABS: 1000.0000 mg | ORAL_TABLET | Freq: Three times a day (TID) | ORAL | 0 refills | Status: AC

## 2019-05-12 NOTE — Progress Notes (Signed)
Acute Office Visit  Subjective:    Patient ID: Tony Keith, male    DOB: 1960-06-04, 59 y.o.   MRN: [id number]  Chief Complaint  Patient presents with  . New Patient (Initial Visit)    Establish care.patient would like to discuss hepatitis, and nother concerns.    HPI Patient is in today for rash on penis.  Onset around one week ago. Improving. Was told it was herpes or hepatitis in past, patient unsure of which.  Rash was itchy, vesicular, no drainage.  No testicular pain, penile pain, discharge or drainage from penis.  No further concerns for STD exposure at this time, declines testing.  Past Medical History:  Diagnosis Date  . Renal disorder     No past surgical history on file.  No family history on file.  Social History   Socioeconomic History  . Marital status: Single    Spouse name: Not on file  . Number of children: Not on file  . Years of education: Not on file  . Highest education level: Not on file  Occupational History  . Not on file  Tobacco Use  . Smoking status: Current Every Day Smoker    Packs/day: 1.00    Years: 10.00    Pack years: 10.00    Types: Cigarettes  . Smokeless tobacco: Current User  Substance and Sexual Activity  . Alcohol use: No  . Drug use: No  . Sexual activity: Yes    Birth control/protection: None  Other Topics Concern  . Not on file  Social History Narrative  . Not on file   Social Determinants of Health   Financial Resource Strain:   . Difficulty of Paying Living Expenses: Not on file  Food Insecurity:   . Worried About Programme researcher, broadcasting/film/video in the Last Year: Not on file  . Ran Out of Food in the Last Year: Not on file  Transportation Needs:   . Lack of Transportation (Medical): Not on file  . Lack of Transportation (Non-Medical): Not on file  Physical Activity:   . Days of Exercise per Week: Not on file  . Minutes of Exercise per Session: Not on file  Stress:   . Feeling of Stress : Not on file  Social  Connections:   . Frequency of Communication with Friends and Family: Not on file  . Frequency of Social Gatherings with Friends and Family: Not on file  . Attends Religious Services: Not on file  . Active Member of Clubs or Organizations: Not on file  . Attends Banker Meetings: Not on file  . Marital Status: Not on file  Intimate Partner Violence:   . Fear of Current or Ex-Partner: Not on file  . Emotionally Abused: Not on file  . Physically Abused: Not on file  . Sexually Abused: Not on file    Outpatient Medications Prior to Visit  Medication Sig Dispense Refill  . fluconazole (DIFLUCAN) 100 MG tablet Take 1.5 tablets (150 mg total) by mouth daily. Repeat if needed 14 tablet 0  . HYDROcodone-acetaminophen (NORCO/VICODIN) 5-325 MG per tablet Take 1 tablet by mouth every 4 (four) hours as needed. 10 tablet 0  . ibuprofen (ADVIL,MOTRIN) 800 MG tablet Take 1 tablet (800 mg total) by mouth 3 (three) times daily. Take with meals 30 tablet 0   No facility-administered medications prior to visit.    No Known Allergies  Review of Systems  Constitutional: Negative.   HENT: Negative.   Eyes: Negative.  Respiratory: Negative.   Cardiovascular: Negative.   Gastrointestinal: Negative.   Endocrine: Negative.   Genitourinary: Negative.   Musculoskeletal: Negative.   Skin: Positive for rash. Negative for color change, pallor and wound.  Allergic/Immunologic: Negative.   Neurological: Negative.   Hematological: Negative.   Psychiatric/Behavioral: Negative.   All other systems reviewed and are negative.      Objective:    Physical Exam Vitals and nursing note reviewed.  Constitutional:      Appearance: Normal appearance. He is normal weight.  Cardiovascular:     Rate and Rhythm: Normal rate and regular rhythm.  Pulmonary:     Effort: Pulmonary effort is normal. No respiratory distress.  Genitourinary:    Pubic Area: No rash or pubic lice.      Penis: Normal and  uncircumcised. No phimosis.      Testes: Normal.       Comments: HSV lesion. Skin:    Capillary Refill: Capillary refill takes less than 2 seconds.  Neurological:     General: No focal deficit present.     Mental Status: He is oriented to person, place, and time. Mental status is at baseline.  Psychiatric:        Mood and Affect: Mood normal.        Behavior: Behavior normal.        Thought Content: Thought content normal.        Judgment: Judgment normal.     BP 131/89   Pulse 77   Temp (!) 97.3 F (36.3 C) (Temporal)   Ht 5' 2.5" (1.588 m)   Wt 137 lb (62.1 kg)   SpO2 96%   BMI 24.66 kg/m  Wt Readings from Last 3 Encounters:  05/12/19 137 lb (62.1 kg)  07/26/13 140 lb (63.5 kg)  11/08/12 147 lb (66.7 kg)    Health Maintenance Due  Topic Date Due  . Hepatitis C Screening  1960/04/28  . COLONOSCOPY  05/14/2011    There are no preventive care reminders to display for this patient.   Lab Results  Component Value Date   TSH 1.863 01/12/2010   Lab Results  Component Value Date   WBC 5.8 02/09/2012   HGB 14.8 02/09/2012   HCT 41.3 02/09/2012   MCV 81.1 02/09/2012   PLT 263 02/09/2012   Lab Results  Component Value Date   NA 139 02/09/2012   K 4.1 02/09/2012   CO2 24 02/09/2012   GLUCOSE 96 02/09/2012   BUN 13 02/09/2012   CREATININE 1.12 02/09/2012   BILITOT 0.4 12/14/2011   ALKPHOS 65 12/14/2011   AST 27 12/14/2011   ALT 11 12/14/2011   PROT 6.3 12/14/2011   ALBUMIN 3.5 12/14/2011   CALCIUM 9.4 02/09/2012   No results found for: CHOL No results found for: HDL No results found for: LDLCALC No results found for: TRIG No results found for: CHOLHDL Lab Results  Component Value Date   HGBA1C  01/12/2010    5.6 (NOTE)                                                                       According to the ADA Clinical Practice Recommendations for 2011, when HbA1c is used as a screening test:   >=  6.5%   Diagnostic of Diabetes Mellitus           (if  abnormal result  is confirmed)  5.7-6.4%   Increased risk of developing Diabetes Mellitus  References:Diagnosis and Classification of Diabetes Mellitus,Diabetes JKKX,3818,29(HBZJI 1):S62-S69 and Standards of Medical Care in         Diabetes - 2011,Diabetes Care,2011,34  (Suppl 1):S11-S61.       Assessment & Plan:   Problem List Items Addressed This Visit    None    Visit Diagnoses    HSV-2 (herpes simplex virus 2) infection    -  Primary   Relevant Medications   valACYclovir (VALTREX) 1000 MG tablet       Meds ordered this encounter  Medications  . valACYclovir (VALTREX) 1000 MG tablet    Sig: Take 1 tablet (1,000 mg total) by mouth 3 (three) times daily.    Dispense:  21 tablet    Refill:  0    Order Specific Question:   Supervising Provider    Answer:   Forrest Moron [id number]   PLAN  Healing appropriately, more than one week since onset so will not treat now.  Will give him 1 week course of valacyclovir to take 1000mg  PO tid at first onset of next rash  Encouraged further testing but pt declined  Pt declined CPE and labs  Patient encouraged to call clinic with any questions, comments, or concerns.   Maximiano Coss, NP

## 2019-05-12 NOTE — Patient Instructions (Signed)
° ° ° °  If you have lab work done today you will be contacted with your lab results within the next 2 weeks.  If you have not heard from us then please contact us. The fastest way to get your results is to register for My Chart. ° ° °IF you received an x-ray today, you will receive an invoice from [facility]. Please contact [facility] at [phone] with questions or concerns regarding your invoice.  ° °IF you received labwork today, you will receive an invoice from [facility]. Please contact [facility] at [phone] with questions or concerns regarding your invoice.  ° °Our billing staff will not be able to assist you with questions regarding bills from these companies. ° °You will be contacted with the lab results as soon as they are available. The fastest way to get your results is to activate your My Chart account. Instructions are located on the last page of this paperwork. If you have not heard from us regarding the results in 2 weeks, please contact this office. °  ° ° ° °

## 2019-08-17 ENCOUNTER — Emergency Department (HOSPITAL_COMMUNITY)
Admission: EM | Admit: 2019-08-17 | Discharge: 2019-08-17 | Disposition: A | Discharge status: Home / Self Care | Payer: 59 | Attending: Emergency Medicine | Admitting: Emergency Medicine

## 2019-08-17 ENCOUNTER — Encounter (HOSPITAL_COMMUNITY): Payer: Self-pay | Admitting: Emergency Medicine

## 2019-08-17 ENCOUNTER — Other Ambulatory Visit: Payer: Self-pay

## 2019-08-17 DIAGNOSIS — X500XXA Overexertion from strenuous movement or load, initial encounter: Secondary | ICD-10-CM | POA: Insufficient documentation

## 2019-08-17 DIAGNOSIS — S3992XA Unspecified injury of lower back, initial encounter: Secondary | ICD-10-CM | POA: Diagnosis present

## 2019-08-17 DIAGNOSIS — Y92007 Garden or yard of unspecified non-institutional (private) residence as the place of occurrence of the external cause: Secondary | ICD-10-CM | POA: Diagnosis not present

## 2019-08-17 DIAGNOSIS — Y93H2 Activity, gardening and landscaping: Secondary | ICD-10-CM | POA: Diagnosis not present

## 2019-08-17 DIAGNOSIS — Y999 Unspecified external cause status: Secondary | ICD-10-CM | POA: Diagnosis not present

## 2019-08-17 DIAGNOSIS — S39012A Strain of muscle, fascia and tendon of lower back, initial encounter: Secondary | ICD-10-CM | POA: Diagnosis not present

## 2019-08-17 DIAGNOSIS — F17210 Nicotine dependence, cigarettes, uncomplicated: Secondary | ICD-10-CM | POA: Diagnosis not present

## 2019-08-17 DIAGNOSIS — Z79899 Other long term (current) drug therapy: Secondary | ICD-10-CM | POA: Insufficient documentation

## 2019-08-17 MED ORDER — IBUPROFEN 800 MG PO TABS: 800.0000 mg | ORAL_TABLET | Freq: Three times a day (TID) | ORAL | 0 refills | Status: AC

## 2019-08-17 MED ORDER — METHOCARBAMOL 500 MG PO TABS: 500.0000 mg | ORAL_TABLET | Freq: Two times a day (BID) | ORAL | 0 refills | Status: AC

## 2019-08-17 NOTE — ED Notes (Signed)
An After Visit Summary was printed and given to the patient. Discharge instructions given and no further questions at this time.  

## 2019-08-17 NOTE — Discharge Instructions (Addendum)
Back Pain:  Back pain is very common.  The pain often gets better over time.  The cause of back pain is usually not dangerous.  Most people can learn to manage their back pain on their own.  However if you develop severe or worsening pain, low back pain with fever, numbness, weakness or inability to walk or urinate/stool, you should return to the ER immediately.  Please follow up with your doctor this week for a recheck if still having symptoms.  Low back pain is discomfort in the lower back that may be due to injuries to muscles and ligaments around the spine.  Occasionally, it may be caused by a a problem to a part of the spine called a disc. The pain may last several days or a week;  However, most patients get completely well in 4 weeks.  Medications are also useful to help with pain control.  A commonly prescribed medications includes acetaminophen.  This medication is generally safe, though you should not take more than 8 of the extra strength (500mg ) pills a day.  Non steroidal anti inflammatory medications including Ibuprofen and naproxen;  These medications help both pain and swelling and are very useful in treating back pain.  They should be taken with food, as they can cause stomach upset, and more seriously, stomach bleeding.  Please do not take for more than 2 weeks.   I have also prescribed a muscle relaxer. Please take as directed at night and do not drink and/or drive on this medication as it can make you sleepy.   Be aware that if you develop new symptoms, such as a fever, leg weakness, difficulty with or loss of control of your urine or bowels, abdominal pain, or more severe pain, you will need to seek medical attention and  / or return to the Emergency department.    Home Care Stay active.  Start with short walks on flat ground if you can.  Try to walk farther each day. Do not sit, drive or stand in one place for more than 30 minutes.  Do not stay in bed. Do not avoid exercise  or work.  Activity can help your back heal faster. Be careful when you bend or lift an object.  Bend at your knees, keep the object close to you, and do not twist. Sleep on a firm mattress.  Lie on your side, and bend your knees.  If you lie on your back, put a pillow under your knees. Only take medicines as told by your doctor. Put ice on the injured area. Put ice in a plastic bag Place a towel between your skin and the bag Leave the ice on for 15-20 minutes, 3-4 times a day for the first 2-3 days. 210 After that, you can switch between ice and heat packs. Ask your doctor about back exercises or massage. Avoid feeling anxious or stressed.  Find good ways to deal with stress, such as exercise.  Get Help Right Way If: Your pain does not go away with rest or medicine. Your pain does not go away in 1 week. You have new problems. You do not feel well. The pain spreads into your legs. You cannot control when you poop (bowel movement) or pee (urinate) You feel sick to your stomach (nauseous) or throw up (vomit) You have belly (abdominal) pain. You feel like you may pass out (faint). If you develop a fever.  Make Sure you: Understand these instructions. Watch your condition Get help  right away if you are not doing well or get worse.  There is a phone number on your discharge paperwork that you can call to establish with a family doctor. Please call their number tomorrow. Return to the ER if your symptoms worsen.

## 2019-08-17 NOTE — ED Provider Notes (Signed)
[facility]-EMERGENCY DEPT Provider Note   CSN: [id number] Arrival date & time: 08/17/19  1815     History Chief Complaint  Patient presents with  . Back Pain    Tony Keith is a 59 y.o. male.  HPI 59 year old male with a history of smoking, acute renal failure presents to the ER with a 3 to 4-day history of low back pain.  Patient states that he was working out in the yard and woke up the next morning with lateral low back pain.  Patient states that he has taken Tylenol and thought that it would improve but it has not.  Pain is worse with movement, more painful when he bends over.  He describes the pain as a "muscle spasm".  He denies any groin numbness, weakness, numbness, tingling, bowel bladder dysfunction, night sweats, foot drop, unintentional weight loss IVDU.    Past Medical History:  Diagnosis Date  . Renal disorder     Patient Active Problem List   Diagnosis Date Noted  . 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 Cramps, muscle, general 12/14/2011  . Hypokalemia 12/14/2011  . Dehydration 12/14/2011  . ARF (acute renal failure) (HCC) 12/13/2011  . SMOKER 01/20/2010  . CELLULITIS, FOOT, RIGHT 01/20/2010    History reviewed. No pertinent surgical history.     No family history on file.  Social History   Tobacco Use  . Smoking status: Current Every Day Smoker    Packs/day: 1.00    Years: 10.00    Pack years: 10.00    Types: Cigarettes  . Smokeless tobacco: Current User  Substance Use Topics  . Alcohol use: No  . Drug use: No    Home Medications Prior to Admission medications   Medication Sig Start Date End Date Taking? Authorizing Provider  fluconazole (DIFLUCAN) 100 MG tablet Take 1.5 tablets (150 mg total) by mouth daily. Repeat if needed 11/08/12   Carmelina Dane, MD  HYDROcodone-acetaminophen (NORCO/VICODIN) 5-325 MG per tablet Take 1 tablet by mouth every 4 (four) hours as needed. 07/26/13   Mellody Drown, PA-C   ibuprofen (ADVIL) 800 MG tablet Take 1 tablet (800 mg total) by mouth 3 (three) times daily. 08/17/19   Mare Ferrari, PA-C  methocarbamol (ROBAXIN) 500 MG tablet Take 1 tablet (500 mg total) by mouth 2 (two) times daily. 08/17/19   Mare Ferrari, PA-C  valACYclovir (VALTREX) 1000 MG tablet Take 1 tablet (1,000 mg total) by mouth 3 (three) times daily. 05/12/19   Janeece Agee, NP    Allergies    Patient has no known allergies.  Review of Systems   Review of Systems  Constitutional: Negative for chills and fever.  Musculoskeletal: Positive for back pain. Negative for myalgias, neck pain and neck stiffness.  Neurological: Negative for weakness and numbness.    Physical Exam Updated Vital Signs BP (!) 124/94   Pulse 73   Temp 98.3 F (36.8 C) (Oral)   Ht 5\' 3"  (1.6 m)   Wt 62.6 kg   SpO2 97%   BMI 24.45 kg/m   Physical Exam Vitals and nursing note reviewed.  Constitutional:      Appearance: He is well-developed.  HENT:     Head: Normocephalic and atraumatic.     Nose: Nose normal.     Mouth/Throat:     Mouth: Mucous membranes are moist.     Pharynx: Oropharynx is clear.  Eyes:     Extraocular Movements: Extraocular movements intact.  Conjunctiva/sclera: Conjunctivae normal.     Pupils: Pupils are equal, round, and reactive to light.  Cardiovascular:     Rate and Rhythm: Normal rate and regular rhythm.     Heart sounds: Normal heart sounds. No murmur.  Pulmonary:     Effort: Pulmonary effort is normal. No respiratory distress.     Breath sounds: Normal breath sounds.  Abdominal:     General: Abdomen is flat.     Palpations: Abdomen is soft.     Tenderness: There is no abdominal tenderness.  Musculoskeletal:        General: Tenderness present. No swelling, deformity or signs of injury. Normal range of motion.     Cervical back: Normal range of motion and neck supple.     Right lower leg: No edema.     Left lower leg: No edema.     Comments: 5/5 strength in  upper and lower extremities bilaterally.  Sensations intact.  Patient was able to ambulate in the ED with no difficulty.  No midline tenderness to L-spine, T-spine, C-spine.  Right-sided paraspinal mild tenderness to palpation in the lumbar region.  No evidence of step-offs, crepitus, erythema, fluctuance.  Skin:    General: Skin is warm and dry.     Capillary Refill: Capillary refill takes less than 2 seconds.     Findings: No bruising, erythema or rash.  Neurological:     General: No focal deficit present.     Mental Status: He is alert and oriented to person, place, and time.     Sensory: No sensory deficit.     Motor: No weakness.     Coordination: Coordination normal.     Gait: Gait normal.  Psychiatric:        Mood and Affect: Mood normal.        Behavior: Behavior normal.     ED Results / Procedures / Treatments   Labs (all labs ordered are listed, but only abnormal results are displayed) Labs Reviewed - No data to display  EKG None  Radiology No results found.  Procedures Procedures (including critical care time)  Medications Ordered in ED Medications - No data to display  ED Course  I have reviewed the triage vital signs and the nursing notes.  Pertinent labs & imaging results that were available during my care of the patient were reviewed by me and considered in my medical decision making (see chart for details).    MDM Rules/Calculators/A&P                      Patient with back pain.  No neurological deficits and normal neuro exam.  Patient can walk with no difficulty, states he has some mild pain with bending over.  No loss of bowel or bladder control.  No concern for cauda equina.  No fever, night sweats, weight loss, h/o cancer, IVDU.  RICE protocol and ibuprofen indicated and discussed with patient. Last creatinine 5 years ago was normal. Robaxin prescribed, patient instructed to take this at night and to not drink and drive on this medication due to  sedating effects.  Cautioned on side effects of NSAIDs.  Provided stretching exercise handout.  Return precautions given.  Patient voices understanding and is agreeable to this plan.  I instructed the patient to call the number on his discharge paperwork to establish with a PCP as he did not have one.  At this stage in the ED course, the patient has been adequately screened  and is stable for discharge.  Final Clinical Impression(s) / ED Diagnoses Final diagnoses:  Strain of lumbar region, initial encounter    Rx / DC Orders ED Discharge Orders         Ordered    methocarbamol (ROBAXIN) 500 MG tablet  2 times daily     08/17/19 1912    ibuprofen (ADVIL) 800 MG tablet  3 times daily     08/17/19 1912           Leone Brand 08/17/19 Lona Millard, MD 08/18/19 [phone]
